# Patient Record
Sex: Male | Born: 1970 | Race: Black or African American | Hispanic: No | Marital: Single | Smoking: Never smoker
Health system: Southern US, Community
[De-identification: ages and names within clinical notes are randomized; demographics above are authoritative.]

## PROBLEM LIST (undated history)

## (undated) DIAGNOSIS — A630 Anogenital (venereal) warts: Secondary | ICD-10-CM

## (undated) DIAGNOSIS — M25511 Pain in right shoulder: Secondary | ICD-10-CM

## (undated) DIAGNOSIS — F32A Depression, unspecified: Secondary | ICD-10-CM

## (undated) DIAGNOSIS — M549 Dorsalgia, unspecified: Secondary | ICD-10-CM

## (undated) DIAGNOSIS — M25512 Pain in left shoulder: Secondary | ICD-10-CM

## (undated) DIAGNOSIS — F329 Major depressive disorder, single episode, unspecified: Secondary | ICD-10-CM

## (undated) DIAGNOSIS — G4730 Sleep apnea, unspecified: Secondary | ICD-10-CM

## (undated) DIAGNOSIS — E7800 Pure hypercholesterolemia, unspecified: Secondary | ICD-10-CM

## (undated) DIAGNOSIS — I10 Essential (primary) hypertension: Secondary | ICD-10-CM

## (undated) DIAGNOSIS — IMO0002 Reserved for concepts with insufficient information to code with codable children: Secondary | ICD-10-CM

## (undated) DIAGNOSIS — M25562 Pain in left knee: Secondary | ICD-10-CM

---

## 2004-07-03 ENCOUNTER — Emergency Department (HOSPITAL_COMMUNITY): Admission: EM | Admit: 2004-07-03 | Discharge: 2004-07-03 | Payer: Self-pay | Admitting: Emergency Medicine

## 2004-07-19 ENCOUNTER — Emergency Department (HOSPITAL_COMMUNITY): Admission: EM | Admit: 2004-07-19 | Discharge: 2004-07-19 | Payer: Self-pay | Admitting: Family Medicine

## 2006-06-10 DIAGNOSIS — M5030 Other cervical disc degeneration, unspecified cervical region: Secondary | ICD-10-CM | POA: Insufficient documentation

## 2007-06-05 ENCOUNTER — Encounter (INDEPENDENT_AMBULATORY_CARE_PROVIDER_SITE_OTHER): Payer: Self-pay | Admitting: Nurse Practitioner

## 2008-07-21 ENCOUNTER — Emergency Department (HOSPITAL_COMMUNITY): Admission: EM | Admit: 2008-07-21 | Discharge: 2008-07-21 | Payer: Self-pay | Admitting: Emergency Medicine

## 2008-08-06 ENCOUNTER — Emergency Department (HOSPITAL_COMMUNITY): Admission: EM | Admit: 2008-08-06 | Discharge: 2008-08-06 | Payer: Self-pay | Admitting: Family Medicine

## 2008-08-15 ENCOUNTER — Emergency Department (HOSPITAL_COMMUNITY): Admission: EM | Admit: 2008-08-15 | Discharge: 2008-08-15 | Payer: Self-pay | Admitting: Family Medicine

## 2008-08-15 DIAGNOSIS — S62233A Other displaced fracture of base of first metacarpal bone, unspecified hand, initial encounter for closed fracture: Secondary | ICD-10-CM | POA: Insufficient documentation

## 2008-10-03 ENCOUNTER — Ambulatory Visit: Payer: Self-pay | Admitting: Nurse Practitioner

## 2008-10-03 DIAGNOSIS — R0989 Other specified symptoms and signs involving the circulatory and respiratory systems: Secondary | ICD-10-CM | POA: Insufficient documentation

## 2008-10-03 DIAGNOSIS — R0609 Other forms of dyspnea: Secondary | ICD-10-CM

## 2008-10-03 DIAGNOSIS — K029 Dental caries, unspecified: Secondary | ICD-10-CM | POA: Insufficient documentation

## 2008-10-03 DIAGNOSIS — I10 Essential (primary) hypertension: Secondary | ICD-10-CM

## 2008-10-19 ENCOUNTER — Ambulatory Visit (HOSPITAL_BASED_OUTPATIENT_CLINIC_OR_DEPARTMENT_OTHER): Admission: RE | Admit: 2008-10-19 | Discharge: 2008-10-19 | Payer: Self-pay | Admitting: Nurse Practitioner

## 2008-10-22 ENCOUNTER — Ambulatory Visit: Payer: Self-pay | Admitting: Internal Medicine

## 2008-10-22 DIAGNOSIS — G4730 Sleep apnea, unspecified: Secondary | ICD-10-CM | POA: Insufficient documentation

## 2008-10-31 ENCOUNTER — Encounter (INDEPENDENT_AMBULATORY_CARE_PROVIDER_SITE_OTHER): Payer: Self-pay | Admitting: Nurse Practitioner

## 2008-11-02 ENCOUNTER — Ambulatory Visit: Payer: Self-pay | Admitting: Nurse Practitioner

## 2008-11-02 DIAGNOSIS — A630 Anogenital (venereal) warts: Secondary | ICD-10-CM

## 2008-11-02 DIAGNOSIS — F17200 Nicotine dependence, unspecified, uncomplicated: Secondary | ICD-10-CM

## 2008-11-04 ENCOUNTER — Encounter (INDEPENDENT_AMBULATORY_CARE_PROVIDER_SITE_OTHER): Payer: Self-pay | Admitting: Nurse Practitioner

## 2008-11-08 ENCOUNTER — Encounter (INDEPENDENT_AMBULATORY_CARE_PROVIDER_SITE_OTHER): Payer: Self-pay | Admitting: Nurse Practitioner

## 2008-11-16 ENCOUNTER — Ambulatory Visit: Payer: Self-pay | Admitting: Nurse Practitioner

## 2008-11-17 ENCOUNTER — Encounter (INDEPENDENT_AMBULATORY_CARE_PROVIDER_SITE_OTHER): Payer: Self-pay | Admitting: Nurse Practitioner

## 2008-11-17 DIAGNOSIS — E7800 Pure hypercholesterolemia, unspecified: Secondary | ICD-10-CM

## 2008-11-17 LAB — CONVERTED CEMR LAB: Triglycerides: 1194 mg/dL — ABNORMAL HIGH (ref <150)

## 2008-12-29 ENCOUNTER — Encounter (INDEPENDENT_AMBULATORY_CARE_PROVIDER_SITE_OTHER): Payer: Self-pay | Admitting: Nurse Practitioner

## 2009-02-10 ENCOUNTER — Ambulatory Visit: Payer: Self-pay | Admitting: Nurse Practitioner

## 2009-02-10 DIAGNOSIS — M545 Low back pain: Secondary | ICD-10-CM | POA: Insufficient documentation

## 2009-02-10 LAB — CONVERTED CEMR LAB: HDL goal, serum: 40 mg/dL

## 2009-02-24 ENCOUNTER — Ambulatory Visit: Payer: Self-pay | Admitting: Nurse Practitioner

## 2009-02-27 ENCOUNTER — Ambulatory Visit: Payer: Self-pay | Admitting: *Deleted

## 2009-02-27 ENCOUNTER — Encounter (INDEPENDENT_AMBULATORY_CARE_PROVIDER_SITE_OTHER): Payer: Self-pay | Admitting: Nurse Practitioner

## 2009-02-27 LAB — CONVERTED CEMR LAB: Total CHOL/HDL Ratio: 6.9

## 2009-03-01 ENCOUNTER — Ambulatory Visit: Payer: Self-pay | Admitting: Nurse Practitioner

## 2009-03-03 ENCOUNTER — Ambulatory Visit (HOSPITAL_COMMUNITY): Admission: RE | Admit: 2009-03-03 | Discharge: 2009-03-03 | Payer: Self-pay | Admitting: Internal Medicine

## 2009-03-21 ENCOUNTER — Emergency Department (HOSPITAL_COMMUNITY): Admission: EM | Admit: 2009-03-21 | Discharge: 2009-03-21 | Payer: Self-pay | Admitting: Family Medicine

## 2009-06-13 ENCOUNTER — Ambulatory Visit: Payer: Self-pay | Admitting: Nurse Practitioner

## 2009-06-13 DIAGNOSIS — M25569 Pain in unspecified knee: Secondary | ICD-10-CM

## 2009-06-13 DIAGNOSIS — M25519 Pain in unspecified shoulder: Secondary | ICD-10-CM

## 2009-06-13 DIAGNOSIS — F341 Dysthymic disorder: Secondary | ICD-10-CM

## 2009-06-13 LAB — CONVERTED CEMR LAB
Bilirubin Urine: NEGATIVE
Glucose, Urine, Semiquant: NEGATIVE
pH: 6

## 2009-06-15 ENCOUNTER — Encounter (INDEPENDENT_AMBULATORY_CARE_PROVIDER_SITE_OTHER): Payer: Self-pay | Admitting: Nurse Practitioner

## 2009-06-15 LAB — CONVERTED CEMR LAB: Cholesterol: 284 mg/dL — ABNORMAL HIGH (ref 0–200)

## 2009-07-03 ENCOUNTER — Encounter (INDEPENDENT_AMBULATORY_CARE_PROVIDER_SITE_OTHER): Payer: Self-pay | Admitting: Nurse Practitioner

## 2009-07-20 ENCOUNTER — Telehealth (INDEPENDENT_AMBULATORY_CARE_PROVIDER_SITE_OTHER): Payer: Self-pay | Admitting: Nurse Practitioner

## 2009-07-25 ENCOUNTER — Ambulatory Visit: Payer: Self-pay | Admitting: Nurse Practitioner

## 2009-07-31 LAB — CONVERTED CEMR LAB
HDL: 50 mg/dL (ref >39)
Triglycerides: 459 mg/dL — ABNORMAL HIGH (ref <150)

## 2009-09-04 ENCOUNTER — Encounter: Admission: RE | Admit: 2009-09-04 | Discharge: 2009-11-29 | Payer: Self-pay | Admitting: Internal Medicine

## 2009-09-07 ENCOUNTER — Encounter (INDEPENDENT_AMBULATORY_CARE_PROVIDER_SITE_OTHER): Payer: Self-pay | Admitting: Nurse Practitioner

## 2009-09-20 ENCOUNTER — Telehealth (INDEPENDENT_AMBULATORY_CARE_PROVIDER_SITE_OTHER): Payer: Self-pay | Admitting: Nurse Practitioner

## 2009-10-04 ENCOUNTER — Encounter (INDEPENDENT_AMBULATORY_CARE_PROVIDER_SITE_OTHER): Payer: Self-pay | Admitting: Nurse Practitioner

## 2009-10-05 ENCOUNTER — Encounter (INDEPENDENT_AMBULATORY_CARE_PROVIDER_SITE_OTHER): Payer: Self-pay | Admitting: *Deleted

## 2009-11-28 ENCOUNTER — Telehealth (INDEPENDENT_AMBULATORY_CARE_PROVIDER_SITE_OTHER): Payer: Self-pay | Admitting: *Deleted

## 2009-12-08 ENCOUNTER — Encounter (INDEPENDENT_AMBULATORY_CARE_PROVIDER_SITE_OTHER): Payer: Self-pay | Admitting: *Deleted

## 2010-01-12 ENCOUNTER — Telehealth (INDEPENDENT_AMBULATORY_CARE_PROVIDER_SITE_OTHER): Payer: Self-pay | Admitting: Nurse Practitioner

## 2010-02-26 ENCOUNTER — Emergency Department (HOSPITAL_COMMUNITY): Admission: EM | Admit: 2010-02-26 | Discharge: 2010-02-26 | Payer: Self-pay | Admitting: Family Medicine

## 2010-06-07 ENCOUNTER — Ambulatory Visit: Payer: Self-pay | Admitting: Nurse Practitioner

## 2010-07-08 LAB — CONVERTED CEMR LAB
Albumin: 4.4 g/dL (ref 3.5–5.2)
Alkaline Phosphatase: 59 units/L (ref 39–117)
Basophils Absolute: 0 10*3/uL (ref 0.0–0.1)
CO2: 23 meq/L (ref 19–32)
Calcium: 9.5 mg/dL (ref 8.4–10.5)
Chlamydia, Swab/Urine, PCR: NEGATIVE
Chloride: 102 meq/L (ref 96–112)
GC Probe Amp, Urine: NEGATIVE
Glucose, Bld: 79 mg/dL (ref 70–99)
Hemoglobin: 16.4 g/dL (ref 13.0–17.0)
Lymphocytes Relative: 23 % (ref 12–46)
Lymphs Abs: 1.8 10*3/uL (ref 0.7–4.0)
Monocytes Absolute: 0.5 10*3/uL (ref 0.1–1.0)
Monocytes Relative: 6 % (ref 3–12)
Neutro Abs: 5.2 10*3/uL (ref 1.7–7.7)
Potassium: 4.1 meq/L (ref 3.5–5.3)
RBC: 5.54 M/uL (ref 4.22–5.81)
Sodium: 141 meq/L (ref 135–145)
Total Protein: 7.7 g/dL (ref 6.0–8.3)
WBC: 7.7 10*3/uL (ref 4.0–10.5)

## 2010-07-12 NOTE — Miscellaneous (Signed)
Summary: Rehab Report//DISCHARGE SUMMARY  Rehab Report//DISCHARGE SUMMARY   Imported By: Arta Bruce 12/06/2009 12:49:37  _____________________________________________________________________  External Attachment:    Type:   Image     Comment:   External Document

## 2010-07-12 NOTE — Assessment & Plan Note (Signed)
Summary: HTN/Hypercholesterolemia   Vital Signs:  Patient profile:   40 year old male Weight:      221.4 pounds BMI:     30.14 BSA:     2.23 Temp:     97.6 degrees F oral Pulse rate:   59 / minute Pulse rhythm:   regular Resp:     16 per minute BP sitting:   150 / 98  (left arm) Cuff size:   regular  Vitals Entered By: Levon Hedger (June 13, 2009 10:24 AM)  Serial Vital Signs/Assessments:  Time      Position  BP       Pulse  Resp  Temp     By 11:51 AM            150/87   63                    Melinda Madtes RN  CC: lower back pain with spasms, shoulders, left knee pain x 1 month....sty on right eye red and swollen, Lipid Management, Depression Is Patient Diabetic? No Pain Assessment Patient in pain? yes     Location: back, shoulders, knee Intensity: 10 Onset of pain  Constant  Does patient need assistance? Functional Status Self care Ambulation Normal   CC:  lower back pain with spasms, shoulders, left knee pain x 1 month....sty on right eye red and swollen, Lipid Management, and Depression.  History of Present Illness:  Pt into the office with continued complaints of back pain and muscle spasms. Spams in right shoulder and lower back.  Left knee pain - feels the knee popping when walking.  Depression History:      The patient presents with symptoms of depression which have been present for greater than two weeks.  The patient is having a depressed mood most of the day and has a diminished interest in his usual daily activities.  Positive alarm features for depression include insomnia and fatigue (loss of energy).  However, he denies recurrent thoughts of death or suicide.        Psychosocial stress factors include major life changes.  The patient denies that he feels like life is not worth living, denies that he wishes that he were dead, and denies that he has thought about ending his life.        Comments:  Pt has noticed a change in himself that is not like  he used to be.  "i'm irritable" No current medications - .  Lipid Management History:      Positive NCEP/ATP III risk factors include hypertension.  Negative NCEP/ATP III risk factors include male age less than 59 years old, non-diabetic, non-tobacco-user status, no ASHD (atherosclerotic heart disease), no prior stroke/TIA, no peripheral vascular disease, and no history of aortic aneurysm.        The patient states that he does not know about the "Therapeutic Lifestyle Change" diet.  The patient does not know about adjunctive measures for cholesterol lowering.  He expresses no side effects from his lipid-lowering medication.  Comments include: Pt has been taking lovaza as ordered as of 3 weeks ago.  The patient denies any symptoms to suggest myopathy or liver disease.      Habits & Providers  Alcohol-Tobacco-Diet     Alcohol drinks/day: <1     Alcohol Counseling: not indicated; use of alcohol is not excessive or problematic     Alcohol type: spirits     Tobacco Status: never  Tobacco Counseling: to quit use of tobacco products     Cigarette Packs/Day: <0.25  Exercise-Depression-Behavior     Does Patient Exercise: no     Exercise Counseling: to improve exercise regimen     Type of exercise: lift weights     Exercise (avg: min/session): <30     Times/week: <3     Have you felt down or hopeless? yes     Have you felt little pleasure in things? yes     Depression Counseling: not indicated; screening negative for depression  Allergies (verified): No Known Drug Allergies  Social History: Does Patient Exercise:  no  Review of Systems General:  Denies fever. Eyes:  Complains of red eye; ? stye in right eye. CV:  Denies chest pain or discomfort. MS:  Complains of joint pain; left knee Low knee and right shoulder - using thera heat pads that he buys over the counter to put on his shoulder and back.  Physical Exam  General:  alert.   Head:  normocephalic.   Eyes:  right upper  eye lid - swollen, no hordeolum present Lungs:  normal breath sounds.   Heart:  normal rate and regular rhythm.     Impression & Recommendations:  Problem # 1:  HYPERTENSION, BENIGN ESSENTIAL (ICD-401.1) BP elevated DASH diet will add lisinopril Orders: UA Dipstick w/o Micro (manual) ([address])  His updated medication list for this problem includes:    Lisinopril 10 Mg Tabs (Lisinopril) ..... One tablet by mouth daily for blood pressure  Problem # 2:  HYPERCHOLESTEROLEMIA (ICD-272.0) will check lipids today His updated medication list for this problem includes:    Lovaza 1 Gm Caps (Omega-3-acid ethyl esters) .Marland Kitchen... 2 capsules by mouth two times a day    Simvastatin 40 Mg Tabs (Simvastatin) ..... One tablet by mouth nightly for cholesterol  Orders: T-Lipid Profile ([id number])  Problem # 3:  KNEE PAIN, LEFT (ICD-719.46)  The following medications were removed from the medication list:    Diclofenac Sodium 75 Mg Tbec (Diclofenac sodium) ..... One tablet by mouth two times a day for pain (take with food)    Tramadol Hcl 50 Mg Tabs (Tramadol hcl) ..... One tablet by mouth two times a day as needed for pain His updated medication list for this problem includes:    Flexeril 10 Mg Tabs (Cyclobenzaprine hcl) ..... One tablet by mouth nightly for as needed for spasms    Naproxen 500 Mg Tabs (Naproxen) ..... One tablet by mouth two times a day as needed for pain  Problem # 4:  TOBACCO ABUSE (ICD-305.1) advised cessation  Problem # 5:  BACK PAIN, LUMBAR (ICD-724.2) chronic. pt has agreed to physical therapy The following medications were removed from the medication list:    Diclofenac Sodium 75 Mg Tbec (Diclofenac sodium) ..... One tablet by mouth two times a day for pain (take with food)    Tramadol Hcl 50 Mg Tabs (Tramadol hcl) ..... One tablet by mouth two times a day as needed for pain His updated medication list for this problem includes:    Flexeril 10 Mg Tabs (Cyclobenzaprine  hcl) ..... One tablet by mouth nightly for as needed for spasms    Naproxen 500 Mg Tabs (Naproxen) ..... One tablet by mouth two times a day as needed for pain  Orders: Physical Therapy Referral (PT)  Complete Medication List: 1)  Lovaza 1 Gm Caps (Omega-3-acid ethyl esters) .... 2 capsules by mouth two times a day 2)  Gabapentin 300 Mg  Caps (Gabapentin) .Marland Kitchen.. 1 capsule by mouth nightly x 1 week then increase to one capsule by mouth two times a day 3)  Flexeril 10 Mg Tabs (Cyclobenzaprine hcl) .... One tablet by mouth nightly for as needed for spasms 4)  Simvastatin 40 Mg Tabs (Simvastatin) .... One tablet by mouth nightly for cholesterol 5)  Lisinopril 10 Mg Tabs (Lisinopril) .... One tablet by mouth daily for blood pressure 6)  Lidoderm 5 % Ptch (Lidocaine) .... Apply to affected area for 12 hours then off 12 hours 7)  Naproxen 500 Mg Tabs (Naproxen) .... One tablet by mouth two times a day as needed for pain 8)  Sertraline Hcl 50 Mg Tabs (Sertraline hcl) .... One tablet by mouth nightly for mood  Other Orders: Misc. Referral (Misc. Ref)  Lipid Assessment/Plan:      Based on NCEP/ATP III, the patient's risk factor category is "2 or more risk factors and a calculated 10 year CAD risk of < 20%".  The patient's lipid goals are as follows: Total cholesterol goal is 200; LDL cholesterol goal is 160; HDL cholesterol goal is 40; Triglyceride goal is 150.    Patient Instructions: 1)  Cholesterol - Your cholesterol will be rechecked today.  Hopefully, it has improved as it was VERY high before. 2)  High Blood pressure - You will need to start lisinopril 10mg  by mouth daily for blood pressure 3)  Apply warm compresses to right eye at least every 2 hours until the swelling goes down. 4)  Back pain - Apply lidoderm patch to affected area - back and shoulder for 12 hours and off for 12 hours 5)  Depression - schedule an appointment with Aquilla Solian to discuss some of the sources of depression 6)   Anxiety/Depression - tingling is due to mood. 7)  You should start zoloft 50mg  by mouth nightly for mood 8)  Follow up in 1 month for medication review - zolft. Prescriptions: SERTRALINE HCL 50 MG TABS (SERTRALINE HCL) One tablet by mouth nightly for mood  #30 x 3   Entered and Authorized by:   Lehman Prom FNP   Signed by:   Lehman Prom FNP on 06/13/2009   Method used:   Faxed to ...       [facility] (retail)       [address] [place], [place]  [address]       Ph: [phone]       Fax: [phone]   RxID:   [id number] NAPROXEN 500 MG TABS (NAPROXEN) One tablet by mouth two times a day as needed for pain  #50 x 0   Entered and Authorized by:   Lehman Prom FNP   Signed by:   Lehman Prom FNP on 06/13/2009   Method used:   Faxed to ...       [facility] - [facility] (retail)       [address] [place], [place]  [address]       Ph: [phone]       Fax: [phone]   RxID:   [id number] LIDODERM 5 % PTCH (LIDOCAINE) Apply to affected area for 12 hours then off 12 hours  #30 x 0   Entered and Authorized by:   Lehman Prom FNP   Signed by:   Lehman Prom FNP on 06/13/2009   Method used:   Faxed to .Marland KitchenMarland Kitchen  [facility] - [facility] (retail)       [address] [place], [place]  [address]       Ph: [phone]       Fax: [phone]   RxID:   [id number] FLEXERIL 10 MG TABS (CYCLOBENZAPRINE HCL) One tablet by mouth nightly for as needed for spasms  #30 x 0   Entered and Authorized by:   Lehman Prom FNP   Signed by:   Lehman Prom FNP on 06/13/2009   Method used:   Faxed to ...       [facility] - [facility] (retail)       [address] [place], [place]  [address]       Ph: [phone]       Fax: [phone]   RxID:   [id number] LISINOPRIL 10 MG TABS (LISINOPRIL) One tablet by  mouth daily for blood pressure  #30 x 3   Entered and Authorized by:   Lehman Prom FNP   Signed by:   Lehman Prom FNP on 06/13/2009   Method used:   Faxed to ...       [facility] - [facility] (retail)       [address] [place], [place]  [address]       Ph: [phone]       Fax: [phone]   RxID:   [id number]   Laboratory Results   Urine Tests  Date/Time Received: June 13, 2009 10:37 AM  Date/Time Reported: June 13, 2009 10:37 AM   Routine Urinalysis   Color: lt. yellow Appearance: Clear Glucose: negative   (Normal Range: Negative) Bilirubin: negative   (Normal Range: Negative) Ketone: negative   (Normal Range: Negative) Spec. Gravity: 1.020   (Normal Range: 1.003-1.035) Blood: negative   (Normal Range: Negative) pH: 6.0   (Normal Range: 5.0-8.0) Protein: 30   (Normal Range: Negative) Urobilinogen: 0.2   (Normal Range: 0-1) Nitrite: negative   (Normal Range: Negative) Leukocyte Esterace: negative   (Normal Range: Negative)        Prevention & Chronic Care Immunizations   Influenza vaccine: Not documented   Influenza vaccine deferral: Refused  (06/13/2009)    Tetanus booster: 11/02/2008: Tdap    Pneumococcal vaccine: Not documented  Other Screening   Smoking status: never  (06/13/2009)  Lipids   Total Cholesterol: 329  (02/24/2009)   LDL: See Comment mg/dL  (71/11/2692)   LDL Direct: Not documented   HDL: 48  (02/24/2009)   Triglycerides: 734  (02/24/2009)    SGOT (AST): 20  (11/02/2008)   SGPT (ALT): 23  (11/02/2008)   Alkaline phosphatase: 59  (11/02/2008)   Total bilirubin: 0.3  (11/02/2008)  Hypertension   Last Blood Pressure: 150 / 98  (06/13/2009)   Serum creatinine: 1.41  (11/02/2008)   Serum potassium 4.1  (11/02/2008)  Self-Management Support :    Hypertension self-management support: Not documented    Lipid self-management support: Not documented

## 2010-07-12 NOTE — Letter (Signed)
Summary: MAILED REQUESTED RECORDS TO [facility]  MAILED REQUESTED RECORDS TO [facility]   Imported By: Arta Bruce 07/03/2009 09:14:29  _____________________________________________________________________  External Attachment:    Type:   Image     Comment:   External Document

## 2010-07-12 NOTE — Miscellaneous (Signed)
Summary: Rehab Report//INITIAL SUMMARY  Rehab Report//INITIAL SUMMARY   Imported By: Arta Bruce 09/19/2009 09:58:09  _____________________________________________________________________  External Attachment:    Type:   Image     Comment:   External Document

## 2010-07-12 NOTE — Progress Notes (Signed)
Summary: STILL WAITING HIS PT REFERRAL  Phone Note Call from Patient Call back at Home Phone [phone]   Reason for Call: Referral Summary of Call: [facility]. MR Kneip CALLED AND SAYS THAT HE HASN'T STARTED HIS PHYSICAL THERAPY, BECAUSE HE WAS TOLD BY THEM THAT THEY ARE WAITING ON A REFERRAL PAPER FROM [place]. HE IS SUPOSE TO BE GOING TO [facility] ON [address]  MR Schepers WANTED TO LET YOU KNOW THAT HE HAS  NO REFILLS ON ANY OF HIS MEDS, HE IS HAVING TO GO BACK THRU [facility], HE LOST HIS ID AND JUST HAD THAT REPLACED, AND HE IS GOING TO DO A WALK-IN [facility] NEXT WEEK. Initial call taken by: Leodis Rains,  September 20, 2009 12:35 PM  Follow-up for Phone Call        forward to N. Daphine Deutscher, FNP Follow-up by: Levon Hedger,  September 26, 2009 11:04 AM  Additional Follow-up for Phone Call Additional follow up Details #1::        unsure why pt was not contacted regarding referral - done back in January  may need to speak with them to verify story New referral ordered with today's date ok to fax so they can scheduled pt Additional Follow-up by: Lehman Prom FNP,  September 26, 2009 12:04 PM    Additional Follow-up for Phone Call Additional follow up Details #2::    Pt noshowed on 08-08-09, then cancelled on 08-23-09 and had his 1st appt on 09-04-09.So I'm not sure what he's talking about. Please help me if you can Follow-up by: Candi Leash,  September 26, 2009 3:07 PM  Additional Follow-up for Phone Call Additional follow up Details #3:: Details for Additional Follow-up Action Taken: Thanks Delma Post - notify pt of the above information as reseached by debra. it is now his responsibilty to keep the appt.  he can't be helped if he will not go to physical therapy and do treatments as ordered n.martin,fnp September 26, 2009  3:13 PM  left msg with lady to have pt call us back.......... Vesta Mixer CMA  September 28, 2009 9:49 AM  Left message with lady for pt to call back.Marland KitchenMarland KitchenMarland KitchenArmenia Shannon  October 03, 2009 2:44 PM called [phone] left message with person that answered to have pt return call to the office.  Will mail letter. Levon Hedger  October 05, 2009 12:50 PM    Additional Follow-up by: Levon Hedger,  October 05, 2009 12:50 PM

## 2010-07-12 NOTE — Letter (Signed)
Summary: *HSN Results Follow up  [facility]  [address] [place], [place] [address]   Phone: [phone]  Fax: [phone]      10/05/2009   [facility] [address] [address] [place], [place]  [address]   Dear  Mr. Majel Homer,                            ____S.Drinkard,FNP   ____D. Gore,FNP       ____B. McPherson,MD   ____V. Rankins,MD    ____E. Mulberry,MD    ____N. Daphine Deutscher, FNP  ____D. Reche Dixon, MD    ____K. Philipp Deputy, MD    ____Other     This letter is to inform you that your recent test(s):  _______Pap Smear    _______Lab Test     _______X-ray    _______ is within acceptable limits  _______ requires a medication change  _______ requires a follow-up lab visit  _______ requires a follow-up visit with your provider   Comments:  We have tried contacting you at [phone].  Please contact the office at your earliest convenience.  This is in reference to physical therapy.       _________________________________________________________ If you have any questions, please contact our office                     Sincerely,  Levon Hedger [facility]

## 2010-07-12 NOTE — Progress Notes (Signed)
Summary: Requesting the Provider call him back  Phone Note Call from Patient Call back at Home Phone [phone]   Summary of Call: Mr. Oguinn wants the provider call him back. [facility] Initial call taken by: Manon Hilding,  July 20, 2009 10:15 AM  Follow-up for Phone Call        pt has appt to come on 2/15 @10 :15 for this issue. Follow-up by: Levon Hedger,  July 24, 2009 2:27 PM

## 2010-07-12 NOTE — Miscellaneous (Signed)
Summary: Rehab Report/INITIAL SUMMARY  Rehab Report/INITIAL SUMMARY   Imported By: Arta Bruce 09/08/2009 10:14:01  _____________________________________________________________________  External Attachment:    Type:   Image     Comment:   External Document

## 2010-07-12 NOTE — Letter (Signed)
Summary: *Referral Letter  [facility]  [address] [place], [place] [address]   Phone: [phone]  Fax: [phone]    07/25/2009  Thomas Lambert [address] [place], [place]  [address]  Phone: [phone]  To whom it may concern: Mr. Samek has been seen in this office for a variety of problems.  See below for details.  He does have problems with his neck and back that limit his ability perform his normal work duties.  He is not able to walk for extended periods of time, nor is he able to lift or do any straining activities.   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) 8)  TOBACCO ABUSE (ICD-305.1) 9)  PHYSICAL EXAMINATION (ICD-V70.0) 10)  SLEEP APNEA (ICD-780.57) 11)  DENTAL CARIES (ICD-521.00) 12)  FRACTURE, THUMB BASE (ICD-815.01) 13)  HYPERTENSION, BENIGN ESSENTIAL (ICD-401.1) 14)  SNORING (ICD-786.09)   Current Medications: 1)  LOVAZA 1 GM CAPS (OMEGA-3-ACID ETHYL ESTERS) 2 capsules by mouth two times a day 2)  GABAPENTIN 300 MG CAPS (GABAPENTIN) 1 capsule by mouth nightly x 1 week then increase to one capsule by mouth two times a day 3)  FLEXERIL 10 MG TABS (CYCLOBENZAPRINE HCL) One tablet by mouth nightly for as needed for spasms 4)  SIMVASTATIN 40 MG TABS (SIMVASTATIN) One tablet by mouth nightly for cholesterol 5)  LISINOPRIL 10 MG TABS (LISINOPRIL) One tablet by mouth daily for blood pressure 6)  LIDODERM 5 % PTCH (LIDOCAINE) Apply to affected area for 12 hours then off 12 hours 7)  NAPROXEN 500 MG TABS (NAPROXEN) One tablet by mouth two times a day as needed for pain 8)  SERTRALINE HCL 50 MG TABS (SERTRALINE HCL) One tablet by mouth nightly for mood   Please contact us if  you have any further questions or need additional information.  Sincerely,   Lehman Prom FNP [facility]

## 2010-07-12 NOTE — Progress Notes (Signed)
Summary: NEEDS REFILLS  Phone Note Refill Request   Refills Requested: Medication #1:  LOVAZA 1 GM CAPS 2 capsules by mouth two times a day  Medication #2:  FLEXERIL 10 MG TABS One tablet by mouth nightly for as needed for spasms  Medication #3:  SIMVASTATIN 40 MG TABS One tablet by mouth nightly for cholesterol  Medication #4:  SERTRALINE HCL 50 MG TABS One tablet by mouth nightly for mood. LIDODERM, LISINOPRIL, NAPROXEN..... [facility] PATIENT WANTED TO LET YOU KNOW HE HAS MEDICAID NOW  Initial call taken by: Leodis Rains,  January 12, 2010 9:16 AM  Follow-up for Phone Call        forward to N. Daphine Deutscher, fnp Follow-up by: Levon Hedger,  January 12, 2010 10:25 AM  Additional Follow-up for Phone Call Additional follow up Details #1::        meds sent to [facility] except lidoderm patch medicaid will not cover these he needs to bring a copy of his medicaid card into this office so we can have on file Additional Follow-up by: Lehman Prom FNP,  January 12, 2010 10:28 AM    Additional Follow-up for Phone Call Additional follow up Details #2::    Levon Hedger  January 12, 2010 2:49 PM Left message on machine for pt to return call to the office.  Levon Hedger  January 15, 2010 9:45 AM pt informed of above information. Follow-up by: Levon Hedger,  January 15, 2010 9:45 AM  Prescriptions: NAPROXEN 500 MG TABS (NAPROXEN) One tablet by mouth two times a day as needed for pain  #50 x 0   Entered and Authorized by:   Lehman Prom FNP   Signed by:   Lehman Prom FNP on 01/12/2010   Method used:   Electronically to        [facility] [address] (retail)       [address]       [place], [place]  [address]       Ph: [phone]       Fax: [phone]   RxID:   [id number] SERTRALINE HCL 50 MG TABS (SERTRALINE HCL) One tablet by mouth nightly for mood  #30 x 3   Entered and Authorized by:   Lehman Prom FNP   Signed by:   Lehman Prom FNP on 01/12/2010   Method used:   Electronically to        [facility] [address] (retail)       [address]       [place], [place]  [address]       Ph: [phone]       Fax: [phone]   RxID:   [id number] LISINOPRIL 10 MG TABS (LISINOPRIL) One tablet by mouth daily for blood pressure  #30 x 3   Entered and Authorized by:   Lehman Prom FNP   Signed by:   Lehman Prom FNP on 01/12/2010   Method used:   Electronically to        [facility] [address] (retail)       [address]       [place], [place]  [address]       Ph: [phone]       Fax: [phone]   RxID:   [id number]  SIMVASTATIN 40 MG TABS (SIMVASTATIN) One tablet by mouth nightly for cholesterol  #30 x 3   Entered and Authorized by:   Lehman Prom FNP   Signed by:   Lehman Prom FNP on 01/12/2010   Method used:   Electronically to        [facility] [address] (retail)       [address]       [place], [place]  [address]       Ph: [phone]       Fax: [phone]   RxID:   [id number] FLEXERIL 10 MG TABS (CYCLOBENZAPRINE HCL) One tablet by mouth nightly for as needed for spasms  #30 x 0   Entered and Authorized by:   Lehman Prom FNP   Signed by:   Lehman Prom FNP on 01/12/2010   Method used:   Electronically to        [facility] [address] (retail)       [address]       [place], [place]  [address]       Ph: [phone]       Fax: [phone]   RxID:   [id number] GABAPENTIN 300 MG CAPS (GABAPENTIN) 1 capsule by mouth nightly x 1 week then increase to one capsule by mouth two times a day  #60 x 3   Entered and Authorized by:   Lehman Prom FNP   Signed by:   Lehman Prom FNP on 01/12/2010   Method used:   Electronically to        [facility] [address] (retail)       [address] [place], [place]  [address]        Ph: [phone]       Fax: [phone]   RxID:   [id number] LOVAZA 1 GM CAPS (OMEGA-3-ACID ETHYL ESTERS) 2 capsules by mouth two times a day  #120 x 3   Entered and Authorized by:   Lehman Prom FNP   Signed by:   Lehman Prom FNP on 01/12/2010   Method used:   Electronically to        [facility] [address] (retail)       [address] [place], [place]  [address]       Ph: [phone]       Fax: [phone]   RxID:   [id number]

## 2010-07-12 NOTE — Progress Notes (Signed)
Summary: Refills request  Phone Note Call from Patient Call back at [phone]   Summary of Call: The pt needs to leave a very important message for his proivider.  The pt lost his wallet and he is looking forward to update his card but in the meantime; he needs refills from all his medications.  [facility] or [facility] [address] [phone]) Daphine Deutscher FnP Initial call taken by: Manon Hilding,  November 28, 2009 4:25 PM  Follow-up for Phone Call        forward to N. Daphine Deutscher, FNP Follow-up by: Levon Hedger,  November 28, 2009 4:28 PM  Additional Follow-up for Phone Call Additional follow up Details #1::        OR - find out exactly [facility] pharmacy pt wants his medications sent to.  I can't sent do Either/OR. If he gets some from one pharmacy and others from another I need to know that specifically - I can't guess that information  Left a message to the pt with a family member to return my phone call.Manon Hilding  December 01, 2009 9:20 AM    Additional Follow-up for Phone Call Additional follow up Details #2::    Levon Hedger  December 02, 2009 12:27 PM Left message for pt to return call to the office.  Levon Hedger  December 05, 2009 2:27 PM Left message for pt to return call to the office.  Levon Hedger  December 08, 2009 9:02 AM Left message for pt to eturn call to the office.  Will mail letter.

## 2010-07-12 NOTE — Assessment & Plan Note (Signed)
Summary: Back pain   Vital Signs:  Patient profile:   40 year old male Weight:      213.4 pounds BMI:     29.05 BSA:     2.19 Temp:     97.3 degrees F oral Pulse rate:   67 / minute Pulse rhythm:   regular Resp:     16 per minute BP sitting:   125 / 84  (left arm) Cuff size:   regular  Vitals Entered By: Levon Hedger (July 25, 2009 10:53 AM) CC: Pain radiating from left side shoulder all the way down to his leg, Back Pain, Hypertension Management, Lipid Management, Depression Is Patient Diabetic? No Pain Assessment Patient in pain? yes     Location: LEFT SIDE Intensity: 10 Onset of pain  Constant  Does patient need assistance? Functional Status Self care Ambulation Normal   CC:  Pain radiating from left side shoulder all the way down to his leg, Back Pain, Hypertension Management, Lipid Management, and Depression.  History of Present Illness:  Pt is into the office for follow up on back. Physical therapy - pt was ordered back in January however pt was not notified of the time date of the appointment. Back pain - continues with radiation down into the left leg.  Naprosyn was inadvertently discarded by a family member  Sleep Apnea - CPAP in place. [facility].    Back Pain History:      The patient's back pain has been present for > 6 weeks.  The pain is located in the lower back region and does radiate below the knees.  He states that he has had a prior history of back pain.  The patient has not had any recent physical therapy for his back pain.        Other comments:  Pt is also started the lidoderm patch as ordered following his last visit. He does see some relief.  Muscle spasm medication (cyclobenaoprine) is also helping.    Critical Exclusionary Diagnosis Criteria (CEDC) for Back Pain:      There are no symptoms to suggest infection, cancer, cauda equina, or psychosocial factors for back pain.  Other positive CEDC factors  include low back pain worse with lumbar extension (downhill walking-standing-reaching overhead).    Depression History:      Positive alarm features for depression include fatigue (loss of energy).        The patient denies that he feels like life is not worth living, denies that he wishes that he were dead, and denies that he has thought about ending his life.        Comments:  Pt has started the zoloft as ordered He did not make an appointment with Aquilla Solian.  Hypertension History:      He denies headache, chest pain, and palpitations.  He notes no problems with any antihypertensive medication side effects.        Positive major cardiovascular risk factors include hyperlipidemia and hypertension.  Negative major cardiovascular risk factors include male age less than 75 years old, no history of diabetes, and non-tobacco-user status.        Further assessment for target organ damage reveals no history of ASHD, cardiac end-organ damage (CHF/LVH), stroke/TIA, peripheral vascular disease, renal insufficiency, or hypertensive retinopathy.    Lipid Management History:      Positive NCEP/ATP III risk factors include hypertension.  Negative NCEP/ATP III risk factors include male age less than 45 years  old, non-diabetic, non-tobacco-user status, no ASHD (atherosclerotic heart disease), no prior stroke/TIA, no peripheral vascular disease, and no history of aortic aneurysm.        The patient states that he does not know about the "Therapeutic Lifestyle Change" diet.  Comments include: Pt is taking lovaza as ordered but reports that he is not taking simvastatin.        Habits & Providers  Alcohol-Tobacco-Diet     Alcohol drinks/day: <1     Alcohol Counseling: not indicated; use of alcohol is not excessive or problematic     Alcohol type: spirits     Tobacco Status: never     Tobacco Counseling: to quit use of tobacco products     Cigarette Packs/Day: <0.25  Exercise-Depression-Behavior      Does Patient Exercise: no     Exercise Counseling: to improve exercise regimen     Type of exercise: lift weights     Exercise (avg: min/session): <30     Times/week: <3     Depression Counseling: not indicated; screening negative for depression  Allergies (verified): No Known Drug Allergies  Review of Systems General:  Denies fever. CV:  Denies chest pain or discomfort. Resp:  Denies cough. GI:  Denies abdominal pain, nausea, and vomiting. MS:  Complains of joint redness and low back pain. Psych:  Complains of depression.  Physical Exam  General:  alert.   Head:  normocephalic.   Neck:  decreased ROM Lungs:  normal breath sounds.   Heart:  normal rate and regular rhythm.   Abdomen:  soft, non-tender, and normal bowel sounds.   Neurologic:  alert & oriented X3.   Skin:  color normal.   Psych:  Oriented X3.     Impression & Recommendations:  Problem # 1:  BACK PAIN, LUMBAR (ICD-724.2) Pt will start physical therapy - ordered on march 1st His updated medication list for this problem includes:    Flexeril 10 Mg Tabs (Cyclobenzaprine hcl) ..... One tablet by mouth nightly for as needed for spasms    Naproxen 500 Mg Tabs (Naproxen) ..... One tablet by mouth two times a day as needed for pain  Problem # 2:  HYPERTENSION, BENIGN ESSENTIAL (ICD-401.1) BP is stable DASH diet His updated medication list for this problem includes:    Lisinopril 10 Mg Tabs (Lisinopril) ..... One tablet by mouth daily for blood pressure  Problem # 3:  DEPRESSION/ANXIETY (ICD-300.4) pt has started zoloft as ordered encouraged him to see Aquilla Solian  Problem # 4:  DEGENERATIVE DISC DISEASE, CERVICAL SPINE (ICD-722.4) physical therapy ordered still ongoing problems - advised pt that he will need referral to ortho for recurrent problems  Problem # 5:  HYPERCHOLESTEROLEMIA (ICD-272.0)  Pt has not been taking simvastatin  advised him to take this in addtion to Lovaza His updated medication  list for this problem includes:    Lovaza 1 Gm Caps (Omega-3-acid ethyl esters) .Marland Kitchen... 2 capsules by mouth two times a day    Simvastatin 40 Mg Tabs (Simvastatin) ..... One tablet by mouth nightly for cholesterol  Orders: T-Lipid Profile ([id number])  Complete Medication List: 1)  Lovaza 1 Gm Caps (Omega-3-acid ethyl esters) .... 2 capsules by mouth two times a day 2)  Gabapentin 300 Mg Caps (Gabapentin) .Marland Kitchen.. 1 capsule by mouth nightly x 1 week then increase to one capsule by mouth two times a day 3)  Flexeril 10 Mg Tabs (Cyclobenzaprine hcl) .... One tablet by mouth nightly for as needed for spasms 4)  Simvastatin 40 Mg Tabs (Simvastatin) .... One tablet by mouth nightly for cholesterol 5)  Lisinopril 10 Mg Tabs (Lisinopril) .... One tablet by mouth daily for blood pressure 6)  Lidoderm 5 % Ptch (Lidocaine) .... Apply to affected area for 12 hours then off 12 hours 7)  Naproxen 500 Mg Tabs (Naproxen) .... One tablet by mouth two times a day as needed for pain 8)  Sertraline Hcl 50 Mg Tabs (Sertraline hcl) .... One tablet by mouth nightly for mood  Hypertension Assessment/Plan:      The patient's hypertensive risk group is category B: At least one risk factor (excluding diabetes) with no target organ damage.  Today's blood pressure is 125/84.  His blood pressure goal is < 140/90.  Lipid Assessment/Plan:      Based on NCEP/ATP III, the patient's risk factor category is "0-1 risk factors".  The patient's lipid goals are as follows: Total cholesterol goal is 200; LDL cholesterol goal is 160; HDL cholesterol goal is 40; Triglyceride goal is 150.    Patient Instructions: 1)  Physical therapy - March 1st at 9:00AM 2)  [address] [phone] 3)  You will learn how to do some exercises at home. 4)  Be sure to keep this appointment as ordered. 5)  Cholesterol - Will recheck today.  May be improved some but likely still will need to take lovaza and simvastatin. 6)  Follow up as  needed Prescriptions: NAPROXEN 500 MG TABS (NAPROXEN) One tablet by mouth two times a day as needed for pain  #50 x 0   Entered and Authorized by:   Lehman Prom FNP   Signed by:   Lehman Prom FNP on 07/25/2009   Method used:   Faxed to ...       [facility] (retail)       [address] [place], [place]  [address]       Ph: [phone]       Fax: [phone]   RxID:   [id number] SIMVASTATIN 40 MG TABS (SIMVASTATIN) One tablet by mouth nightly for cholesterol  #30 x 3   Entered and Authorized by:   Lehman Prom FNP   Signed by:   Lehman Prom FNP on 07/25/2009   Method used:   Faxed to ...       [facility] - [facility] (retail)       [address] [place], [place]  [address]       Ph: [phone]       Fax: [phone]   RxID:   [id number] FLEXERIL 10 MG TABS (CYCLOBENZAPRINE HCL) One tablet by mouth nightly for as needed for spasms  #30 x 0   Entered and Authorized by:   Lehman Prom FNP   Signed by:   Lehman Prom FNP on 07/25/2009   Method used:   Faxed to ...       [facility] - [facility] (retail)       [address] [place], [place]  [address]       Ph: [phone]       Fax: [phone]   RxID:   [id number]

## 2010-07-12 NOTE — Letter (Signed)
Summary: Lipid Letter  [facility]  [address] [place], [place] [address]   Phone: [phone]  Fax: [phone]    06/15/2009  Thomas Lambert [address] [place], [place]  [address]  Dear Derek Mound:  We have carefully reviewed your last lipid profile from 06/13/2009 and the results are noted below with a summary of recommendations for lipid management.    Cholesterol:       284     Goal: less than 200   HDL "good" Cholesterol:   48     Goal: greater than 40   LDL "bad" Cholesterol:  Too high to calculate   Triglycerides:       653     Goal: less than 150    Your cholesterol is DANGEROUSLY high. This is what leads to heart attack and stroke. You have 2 different cholesterol medications that you should be taking. Be sure you request the refills from the pharmacy and take as ordered.     Current Medications: 1)    Lovaza 1 Gm Caps (Omega-3-acid ethyl esters) .... 2 capsules by mouth two times a day 2)    Gabapentin 300 Mg Caps (Gabapentin) .Marland Kitchen.. 1 capsule by mouth nightly x 1 week then increase to one capsule by mouth two times a day 3)    Flexeril 10 Mg Tabs (Cyclobenzaprine hcl) .... One tablet by mouth nightly for as needed for spasms 4)    Diclofenac Sodium 75 Mg Tbec (Diclofenac sodium) .... One tablet by mouth two times a day for pain (take with food) 5)    Simvastatin 40 Mg Tabs (Simvastatin) .... One tablet by mouth nightly for cholesterol 6)    Tramadol Hcl 50 Mg Tabs (Tramadol hcl) .... One tablet by mouth two times a day as needed for pain 7)    Lisinopril 10 Mg Tabs (Lisinopril) .... One tablet by mouth daily for blood pressure 8)    Lidoderm 5 % Ptch (Lidocaine) .... Apply to affected area for 12 hours then off 12 hours 9)    Naproxen 500 Mg Tabs (Naproxen) .... One tablet by mouth two times a day as needed for pain 10)    Sertraline Hcl 50 Mg Tabs (Sertraline hcl) .... One tablet by mouth nightly for mood  If you have any questions, please call.  We appreciate being able to work with you.   Sincerely,    [facility] Lehman Prom FNP

## 2010-07-12 NOTE — Letter (Signed)
Summary: *HSN Results Follow up  [facility]  [address] [place], [place] [address]   Phone: [phone]  Fax: [phone]      12/08/2009   BENJAMIN CASANAS [address] [place], [place]  [address]   Dear  Mr. Majel Homer,                            ____S.Drinkard,FNP   ____D. Gore,FNP       ____B. McPherson,MD   ____V. Rankins,MD    ____E. Mulberry,MD    ____N. Daphine Deutscher, FNP  ____D. Reche Dixon, MD    ____K. Philipp Deputy, MD    ____Other     This letter is to inform you that your recent test(s):  _______Pap Smear    _______Lab Test     _______X-ray    _______ is within acceptable limits  _______ requires a medication change  _______ requires a follow-up lab visit  _______ requires a follow-up visit with your provider   Comments: We have been trying to reach you at [phone].  Please contact the office at your earliest convenience.  This is in reference to your medication.      _________________________________________________________ If you have any questions, please contact our office                     Sincerely,  Levon Hedger [facility]

## 2010-07-27 ENCOUNTER — Telehealth (INDEPENDENT_AMBULATORY_CARE_PROVIDER_SITE_OTHER): Payer: Self-pay | Admitting: Nurse Practitioner

## 2010-08-07 NOTE — Progress Notes (Signed)
Summary: Needs chiropractic referral  Phone Note Call from Patient Call back at Home Phone [phone] Call back at [phone]   Summary of Call: pt saw a  chirapatric for the problems he is having for his neck and lower back.... since he has medicad they said that he would need a referral to see them.... pt has appt on Monday...Thomas KitchenMarland Lambert pt would like a call back to make sure he can go to appt at [facility] on Monday or if he should rescheudle Initial call taken by: [place] Shannon,  July 27, 2010 3:10 PM  Follow-up for Phone Call        Left message on answering machine for pt. to return call.  Dutch Quint RN  July 27, 2010 3:21 PM  Left message on answering machine for pt. to return call.  Last seen 07/25/09 for back pain.  Would he need another appointment for referral?  Dutch Quint RN  July 30, 2010 4:35 PM    Additional Follow-up for Phone Call Additional follow up Details #1::        Referral done - fax to that office find out from pt where he wants it sent  he does need a f/u appt with provider as it has been 1 year since his last visit Additional Follow-up by: Lehman Prom FNP,  July 31, 2010 1:40 PM    Additional Follow-up for Phone Call Additional follow up Details #2::    Pt. requests that referral sent to [facility] - Phone [phone] // Fax [phone].  Appt. scheduled with provider 08/17/10.  Dutch Quint RN  August 02, 2010 10:32 AM  Noted I faxe the referral to [facility] and pt has an appt 08-17-10  Follow-up by: Cheryll Dessert,  August 02, 2010 3:37 PM

## 2010-10-23 NOTE — Procedures (Signed)
NAME:  Thomas Lambert, Thomas Lambert NO.:  [id number]   MEDICAL RECORD NO.:  [id number]          PATIENT TYPE:  OUT   LOCATION:  SLEEP CENTER                 FACILITY[facility]  [facility]   PHYSICIAN:  Clinton D. Maple Hudson, MD, FCCP, FACPDATE OF BIRTH:  1969-03-08   DATE OF STUDY:                            NOCTURNAL POLYSOMNOGRAM   REFERRING PHYSICIAN:  Lehman Prom   INDICATION FOR STUDY:  Hypersomnia with sleep apnea.   EPWORTH SLEEPINESS SCORE:  4/24.  BMI 29.8.  Weight 220 pounds.  Height  72 inches.  Neck 16 inches.   MEDICATIONS:  None listed.   SLEEP ARCHITECTURE:  Split study protocol.  During the diagnostic phase,  total sleep time was 126 minutes with sleep efficiency 68.2%.  Stage I  was 10.3%.  Stage II was 89.7%.  Stages III and REM were absent.  Sleep  latency 29 minutes.  REM latency NA.  Awake after sleep onset 22  minutes.  Arousal index 101 indicating increased EEG arousal.  No  bedtime medication was taken.   RESPIRATORY DATA:  Split study protocol.  During the diagnostic phase,  apnea/hypopnea index (AHI) 56.9 per hour.  A total of 120 events were  scored including 9 obstructive apneas and 111 hypopneas.  Events were  positional, much more common while supine.  CPAP was then titrated to 12  CWP, AHI 0 per hour.  He wore a medium ResMed Mirage Quattro full face  mask with heated humidifier.   OXYGEN DATA:  Very loud snoring before CPAP with oxygen desaturation to  a nadir of 87%.  After CPAP control, mean oxygen saturation held 97.9%  on room air.   CARDIAC DATA:  Normal sinus rhythm.   MOVEMENT-PARASOMNIA:  A few limb jerks were noted during CPAP titration,  insignificant.  Bathroom x1.   IMPRESSIONS-RECOMMENDATIONS:  1. Severe obstructive sleep apnea/hypopnea syndrome, AHI 56.9 per hour      with events more common while supine, but seen in all sleep      positions.  Loud snoring with oxygen desaturation to a nadir of      87%.  2. Successful CPAP titration  to 12 CWP, AHI 0 per hour.  He wore a      medium ResMed Mirage Quattro full face mask with heated humidifier.      Clinton D. Maple Hudson, MD, [facility], FACP  Diplomate, Biomedical engineer of Sleep Medicine  Electronically Signed     CDY/MEDQ  D:  10/22/2008 14:57:36  T:  10/23/2008 07:14:48  Job:  [id number]

## 2011-05-20 ENCOUNTER — Emergency Department (HOSPITAL_COMMUNITY)
Admission: EM | Admit: 2011-05-20 | Discharge: 2011-05-20 | Discharge status: Against Medical Advice | Payer: Medicaid Other | Source: Home / Self Care

## 2011-05-20 NOTE — ED Notes (Signed)
Pt called in all waiting areas, unable to locate pt

## 2011-05-21 ENCOUNTER — Encounter: Payer: Self-pay | Admitting: *Deleted

## 2011-05-21 ENCOUNTER — Emergency Department (HOSPITAL_COMMUNITY)
Admission: EM | Admit: 2011-05-21 | Discharge: 2011-05-22 | Disposition: A | Discharge status: Other Acute Inpatient Hospital | Payer: Medicaid Other | Source: Home / Self Care | Attending: Emergency Medicine | Admitting: Emergency Medicine

## 2011-05-21 DIAGNOSIS — R443 Hallucinations, unspecified: Secondary | ICD-10-CM | POA: Insufficient documentation

## 2011-05-21 HISTORY — DX: Dorsalgia, unspecified: M54.9

## 2011-05-21 HISTORY — DX: Pure hypercholesterolemia, unspecified: E78.00

## 2011-05-21 HISTORY — DX: Anogenital (venereal) warts: A63.0

## 2011-05-21 HISTORY — DX: Essential (primary) hypertension: I10

## 2011-05-21 HISTORY — DX: Depression, unspecified: F32.A

## 2011-05-21 HISTORY — DX: Sleep apnea, unspecified: G47.30

## 2011-05-21 HISTORY — DX: Pain in left knee: M25.562

## 2011-05-21 HISTORY — DX: Reserved for concepts with insufficient information to code with codable children: IMO0002

## 2011-05-21 HISTORY — DX: Major depressive disorder, single episode, unspecified: F32.9

## 2011-05-21 HISTORY — DX: Pain in left shoulder: M25.512

## 2011-05-21 HISTORY — DX: Pain in right shoulder: M25.511

## 2011-05-21 LAB — RAPID URINE DRUG SCREEN, HOSP PERFORMED
Amphetamines: NOT DETECTED
Benzodiazepines: NOT DETECTED
Cocaine: NOT DETECTED
Opiates: NOT DETECTED

## 2011-05-21 LAB — URINALYSIS, ROUTINE W REFLEX MICROSCOPIC
Glucose, UA: NEGATIVE mg/dL
Hgb urine dipstick: NEGATIVE
Protein, ur: NEGATIVE mg/dL
pH: 7 (ref 5.0–8.0)

## 2011-05-21 LAB — DIFFERENTIAL
Basophils Absolute: 0.1 10*3/uL (ref 0.0–0.1)
Basophils Relative: 1 % (ref 0–1)
Monocytes Absolute: 0.5 10*3/uL (ref 0.1–1.0)
Neutro Abs: 4.4 10*3/uL (ref 1.7–7.7)
Neutrophils Relative %: 60 % (ref 43–77)

## 2011-05-21 LAB — COMPREHENSIVE METABOLIC PANEL
AST: 24 U/L (ref 0–37)
Albumin: 3.9 g/dL (ref 3.5–5.2)
Chloride: 101 mEq/L (ref 96–112)
Creatinine, Ser: 1.31 mg/dL (ref 0.50–1.35)
Potassium: 4.4 mEq/L (ref 3.5–5.1)
Total Bilirubin: 0.5 mg/dL (ref 0.3–1.2)
Total Protein: 7.8 g/dL (ref 6.0–8.3)

## 2011-05-21 LAB — CBC
MCHC: 34.3 g/dL (ref 30.0–36.0)
RDW: 13.8 % (ref 11.5–15.5)

## 2011-05-21 LAB — ETHANOL: Alcohol, Ethyl (B): <11 mg/dL (ref 0–11)

## 2011-05-21 NOTE — ED Provider Notes (Signed)
History     CSN: [id number] Arrival date & time: 05/21/2011  5:22 PM   First MD Initiated Contact with Patient 05/21/11 1833      Chief Complaint  Patient presents with  . Hallucinations    (Consider location/radiation/quality/duration/timing/severity/associated sxs/prior treatment) HPI The patient presents with concerns of increasing hallucinations. He notes a history of depression, anxiety, with intermittent compliance with Zoloft. He notes that over the past months he has lost multiple family members and a neighbor. She also describes personal life stress. Prior to approximately 6 months the patient no history of hallucinations. He now notes that he frequently sees history mother and other individuals walking about. He denies significant auditory hallucinations. He denies suicidal ideation but notes that he feels overwhelmed. He also describes paranoid thoughts regarding possible alterations of his food by unknown people, as well as alleging that a rubbery was connected by others, but erroneously attributed to him No past medical history on file.  No past surgical history on file.  No family history on file.  History  Substance Use Topics  . Smoking status: Not on file  . Smokeless tobacco: Not on file  . Alcohol Use: Not on file      Review of Systems  Constitutional:       Per HPI, otherwise negative  HENT:       Per HPI, otherwise negative  Eyes: Negative.   Respiratory:       Per HPI, otherwise negative  Cardiovascular:       Per HPI, otherwise negative  Gastrointestinal: Negative for vomiting.  Genitourinary: Negative.   Musculoskeletal:       Per HPI, otherwise negative  Skin: Negative.   Neurological: Negative for syncope.  Psychiatric/Behavioral: Positive for hallucinations. Negative for self-injury. The patient is nervous/anxious.     Allergies  Review of patient's allergies indicates no known allergies.  Home Medications   Current Outpatient Rx    Name Route Sig Dispense Refill  . GABAPENTIN 300 MG PO CAPS Oral Take 300 mg by mouth 2 (two) times daily.      Marland Kitchen LIDOCAINE 5 % EX PTCH Transdermal Place 1 patch onto the skin daily. Remove & Discard patch within 12 hours or as directed by MD     . LISINOPRIL 10 MG PO TABS Oral Take 10 mg by mouth daily.      Marland Kitchen NAPROXEN 500 MG PO TABS Oral Take 500 mg by mouth 2 (two) times daily as needed. For pain.     Marland Kitchen OMEGA-3-ACID ETHYL ESTERS 1 G PO CAPS Oral Take 2 g by mouth 2 (two) times daily.      . SERTRALINE HCL 50 MG PO TABS Oral Take 50 mg by mouth at bedtime.      Marland Kitchen SIMVASTATIN 40 MG PO TABS Oral Take 40 mg by mouth at bedtime.        BP 129/90  Pulse 69  Temp(Src) 98.4 F (36.9 C) (Oral)  Resp 18  SpO2 96%  Physical Exam  Constitutional: He is oriented to person, place, and time. He appears well-developed and well-nourished.  HENT:  Head: Normocephalic and atraumatic.  Eyes: Conjunctivae are normal. Pupils are equal, round, and reactive to light.  Neck: Neck supple.  Cardiovascular: Normal rate and regular rhythm.   Pulmonary/Chest: No respiratory distress.  Abdominal: Soft. There is no tenderness.  Musculoskeletal: He exhibits no edema.  Neurological: He is alert and oriented to person, place, and time.  Skin: Skin is warm and dry.  Psychiatric: His speech is normal and behavior is normal. Judgment normal. His mood appears anxious. Thought content is paranoid and delusional. Cognition and memory are normal. He exhibits a depressed mood. He expresses no homicidal and no suicidal ideation. He expresses no suicidal plans.    ED Course  Procedures (including critical care time)   Labs Reviewed  CBC  DIFFERENTIAL  COMPREHENSIVE METABOLIC PANEL  URINALYSIS, ROUTINE W REFLEX MICROSCOPIC  ETHANOL  URINE RAPID DRUG SCREEN (HOSP PERFORMED)   No results found.   No diagnosis found.    MDM  This gentleman with depression and anxiety now presents with concerns of  increasing/ongoing hallucinations. On exam the patient is in no distress though he endorses hallucinations, and his speech is demonstrative of paranoid thought pattern. The patient denies suicidal ideation. Given the patient's history of depression, and the notable number of family deaths, this may represent an extreme grief reaction, though there is concern for new psychosis. The patient will be transferred for further evaluation and management by behavioral health        Gerhard Munch, MD 05/21/11 [phone]

## 2011-05-21 NOTE — ED Notes (Signed)
To ed for eval after having hallucinations. States he has been seeing his grandmother who died, seeing his neighbor who 'died in front of him' a cple yrs ago. Pt states he has been under a lot of stress lately. Denies SI right now. States 'i know it's wrong', but i have had feelings of no purpose. Pt is calm and cooperative.

## 2011-05-21 NOTE — ED Notes (Signed)
Pt reports having auditory and visual hallucinations x 6 months.  Has been in treatment for it with no relief.  Pt reports seeing and hearing his grandmother who passed away and also his neighbor who also passed away.  Pt denies HI or SI.  States that the voices are people talking to him nicely, no intent to harm himself.  Skin warm, dry and intact.  Neuro intact.

## 2011-05-21 NOTE — ED Notes (Signed)
Spoke with Dr. Lucrezia Starch, updated MD on pt and pt now doing tele psych consult.

## 2011-05-21 NOTE — ED Notes (Signed)
ACT Team at bedside for pt evaluation.

## 2011-05-21 NOTE — ED Notes (Signed)
Pt has completed tele psych at this time.
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
## 2011-05-22 ENCOUNTER — Encounter (HOSPITAL_COMMUNITY): Payer: Self-pay | Admitting: *Deleted

## 2011-05-22 ENCOUNTER — Inpatient Hospital Stay (HOSPITAL_COMMUNITY): Admission: EM | Admit: 2011-05-22 | Payer: Self-pay

## 2011-05-22 ENCOUNTER — Inpatient Hospital Stay (HOSPITAL_COMMUNITY)
Admission: EM | Admit: 2011-05-22 | Discharge: 2011-05-24 | DRG: 885 | Disposition: A | Discharge status: Home / Self Care | Payer: Medicaid Other | Attending: Psychiatry | Admitting: Psychiatry

## 2011-05-22 DIAGNOSIS — G8929 Other chronic pain: Secondary | ICD-10-CM

## 2011-05-22 DIAGNOSIS — M25569 Pain in unspecified knee: Secondary | ICD-10-CM

## 2011-05-22 DIAGNOSIS — E7800 Pure hypercholesterolemia, unspecified: Secondary | ICD-10-CM

## 2011-05-22 DIAGNOSIS — Z79899 Other long term (current) drug therapy: Secondary | ICD-10-CM

## 2011-05-22 DIAGNOSIS — I10 Essential (primary) hypertension: Secondary | ICD-10-CM

## 2011-05-22 DIAGNOSIS — F341 Dysthymic disorder: Secondary | ICD-10-CM

## 2011-05-22 DIAGNOSIS — G4730 Sleep apnea, unspecified: Secondary | ICD-10-CM

## 2011-05-22 DIAGNOSIS — M25519 Pain in unspecified shoulder: Secondary | ICD-10-CM

## 2011-05-22 DIAGNOSIS — F1210 Cannabis abuse, uncomplicated: Secondary | ICD-10-CM

## 2011-05-22 DIAGNOSIS — F411 Generalized anxiety disorder: Secondary | ICD-10-CM | POA: Diagnosis present

## 2011-05-22 DIAGNOSIS — IMO0002 Reserved for concepts with insufficient information to code with codable children: Secondary | ICD-10-CM

## 2011-05-22 DIAGNOSIS — F333 Major depressive disorder, recurrent, severe with psychotic symptoms: Principal | ICD-10-CM

## 2011-05-22 MED ORDER — MAGNESIUM HYDROXIDE 400 MG/5ML PO SUSP: 30.0000 mL | Freq: Every day | ORAL | Status: DC | PRN

## 2011-05-22 MED ORDER — NAPROXEN 500 MG PO TABS: 500.0000 mg | ORAL_TABLET | Freq: Two times a day (BID) | ORAL | Status: DC | PRN

## 2011-05-22 MED ORDER — GABAPENTIN 600 MG PO TABS
600.0000 mg | ORAL_TABLET | ORAL | Status: DC
  Administered 2011-05-22 – 2011-05-24 (×5): 600 mg via ORAL
  Filled 2011-05-22: qty 21
  Filled 2011-05-22 (×6): qty 1
  Filled 2011-05-22: qty 21
  Filled 2011-05-22: qty 1
  Filled 2011-05-22 (×2): qty 21
  Filled 2011-05-22: qty 1
  Filled 2011-05-22 (×2): qty 21

## 2011-05-22 MED ORDER — ACETAMINOPHEN 325 MG PO TABS: 650.0000 mg | ORAL_TABLET | Freq: Four times a day (QID) | ORAL | Status: DC | PRN

## 2011-05-22 MED ORDER — SIMVASTATIN 40 MG PO TABS
40.0000 mg | ORAL_TABLET | Freq: Every day | ORAL | Status: DC
  Administered 2011-05-22 – 2011-05-23 (×2): 40 mg via ORAL
  Filled 2011-05-22 (×3): qty 1
  Filled 2011-05-22 (×2): qty 3

## 2011-05-22 MED ORDER — OMEGA-3-ACID ETHYL ESTERS 1 G PO CAPS
2.0000 g | ORAL_CAPSULE | ORAL | Status: DC
  Administered 2011-05-22 – 2011-05-23 (×3): 2 g via ORAL
  Filled 2011-05-22 (×2): qty 2
  Filled 2011-05-22 (×2): qty 12
  Filled 2011-05-22 (×2): qty 2
  Filled 2011-05-22: qty 12
  Filled 2011-05-22: qty 2
  Filled 2011-05-22: qty 12
  Filled 2011-05-22: qty 2

## 2011-05-22 MED ORDER — SERTRALINE HCL 100 MG PO TABS
100.0000 mg | ORAL_TABLET | Freq: Every day | ORAL | Status: DC
  Administered 2011-05-22 – 2011-05-24 (×3): 100 mg via ORAL
  Filled 2011-05-22 (×3): qty 1
  Filled 2011-05-22 (×2): qty 14

## 2011-05-22 MED ORDER — ALUM & MAG HYDROXIDE-SIMETH 200-200-20 MG/5ML PO SUSP: 30.0000 mL | ORAL | Status: DC | PRN

## 2011-05-22 MED ORDER — HYDROXYZINE HCL 25 MG PO TABS: 25.0000 mg | ORAL_TABLET | ORAL | Status: DC | PRN

## 2011-05-22 MED ORDER — LISINOPRIL 10 MG PO TABS
10.0000 mg | ORAL_TABLET | Freq: Every day | ORAL | Status: DC
  Administered 2011-05-22 – 2011-05-24 (×3): 10 mg via ORAL
  Filled 2011-05-22: qty 3
  Filled 2011-05-22 (×5): qty 1
  Filled 2011-05-22: qty 3

## 2011-05-22 MED ORDER — ACETAMINOPHEN 325 MG PO TABS
ORAL_TABLET | ORAL | Status: AC
  Administered 2011-05-22: 650 mg
  Filled 2011-05-22: qty 2

## 2011-05-22 MED ORDER — RISPERIDONE 1 MG PO TABS
1.0000 mg | ORAL_TABLET | Freq: Every day | ORAL | Status: DC
  Administered 2011-05-22 – 2011-05-23 (×2): 1 mg via ORAL
  Filled 2011-05-22: qty 1
  Filled 2011-05-22: qty 14
  Filled 2011-05-22: qty 1
  Filled 2011-05-22: qty 14
  Filled 2011-05-22: qty 1
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
## 2011-05-22 NOTE — Progress Notes (Signed)
Patient ID: Thomas Lambert, male   DOB: 1971-04-22, 40 y.o.   MRN: [id number] Pt is voluntary. Pt came in on request of his therapist. Pt stated "I am seeing my died grandma and my neighbor I found dead for the last 6 months." Pt wants help. Pt is pleasant and joking around during admission. Pt has not been on his medications for a while and has recently been in jail for 20 days.  Pt  States "I do drink and smoke weed every now and then."Pt denies SI/HI/.

## 2011-05-22 NOTE — Progress Notes (Signed)
Pt. Was in the dayroom interacting with staff and his peers.  Pt. Stated that he appreciates the staff here at [facility] and it has been a blessing to him to be here.  Pt. Is very grandiose and reports that he has brightened the day of many of the peers on the 400 hall.  Pt. Discusses being in jail and puts the blame on others but states that it changed his life and he is making needed changes.  Pt. Denies SI, HI and AH, but reports that he has been seeing his dead Grandmother,  And neighbor who he states he found dead.    Pt. Was pleasant and cooperative.  Pt. Does talk of chronic back pain which he was scheduled and given neurontin at HS to relieve the pain.  Pt. Reports that this helped in the past.  Pt. Has no other complaints of pain or discomfort noted at this time. Pt. Is now resting quietly.

## 2011-05-22 NOTE — Tx Team (Signed)
Interdisciplinary Treatment Plan Update (Adult)  Date:  05/22/2011  Time Reviewed:  11:11 AM   Progress in Treatment: Attending groups:  Just arrived Participating in groups:    N/A Taking medication as prescribed:    Just arrived Tolerating medication:   N/A Family/Significant other contact made:  Not yet, just arrived Patient understands diagnosis:   Is talking extensively about his depression Discussing patient identified problems/goals with staff:   Yes, fully engaged Medical problems stabilized or resolved:   Unknown, just arrived Denies suicidal/homicidal ideation:  Yes, "I've never wanted to do that." Issues/concerns per patient self-inventory:   Not done today, just arrived. Other:  New problem(s) identified: No, Describe:    Reason for Continuation of Hospitalization: Anxiety Delusions  Depression Hallucinations  Interventions implemented related to continuation of hospitalization:  Medication monitoring and adjustment, safety checks Q15 min., group therapy, psychoeducation, collateral contact  Additional comments:  Extremely strong odor of feces  Estimated length of stay:  Discharge Plan:  Has been getting medications at [facility], but has an appointment to go to [facility].    New goal(s):  Not applicable  Review of initial/current patient goals per problem list:   1.  Goal(s):  Determine extent of symptoms, what is real and what is delusion/hallucinations  Met:  No  Target date:  By Discharge   As evidenced by:  Talking to patient currently to determine extent, will also seek collateral contact; short temper, depression  2.  Goal(s):  Reduce psychosis to baseline  Met:  No  Target date:  By Discharge   As evidenced by:  Visual hallucinations and voices  3.  Goal(s):  Return sleep to normal pattern  Met:  No  Target date:  By Discharge   As evidenced by:  "Not at all" when asked if is sleeping  4.  Goal(s):  Reduce depression to  3 at the most.  Met:  No  Target date:  By Discharge   As evidenced by:  Today is at 8, a little less hopeless than yesterday  Attendees: Patient:  Thomas Lambert  05/22/2011  11:11 AM   Family:     Physician:  Dr. Harvie Heck Readling 05/22/2011  11:11 AM   Nursing:   Robbie Louis, RN 05/22/2011  11:11 AM   Case Manager:  Ambrose Mantle, LCSW 05/22/2011  11:11 AM   Counselor:        Other:   Other:     Other:     Other:      Scribe for Treatment Team:   Sarina Ser, 05/22/2011, 11:11 AM
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
## 2011-05-22 NOTE — ED Provider Notes (Signed)
730 am pt alert , ambulatory ,pleassant cooperative, gcs 15  Plan transfer to [facility] dr readling acepting mD  Doug Sou, MD 05/22/11 [phone]

## 2011-05-23 LAB — T3, FREE: T3, Free: 2.9 pg/mL (ref 2.3–4.2)

## 2011-05-23 LAB — T4, FREE: Free T4: 0.98 ng/dL (ref 0.80–1.80)

## 2011-05-23 NOTE — Discharge Planning (Signed)
With Thomas Lambert's verbal consent in front of MHTs Noralee Stain, Case Manager called mother Thomas Lambert [phone].  She states that she has spoken by phone with Thomas Lambert today and feels he is back to his normal self.  She has no concerns about discharge tomorrow.  She states that he takes his medication regularly.  His uncle or brother can come to pick him up.  His "spirits seem greatly improved".  She states he was not suicidal, and Case Manager can find no evidence in the record of SI.  Mother feels Thomas Lambert is ready for discharge.  Ambrose Mantle, LCSW 05/23/2011, 6:29 PM

## 2011-05-23 NOTE — Progress Notes (Signed)
[facility] Group Notes:  (Counselor/Nursing/MHT/Case Management/Adjunct)  05/23/2011 4:48 PM  Type of Therapy:  Group therapy  Participation Level:  Active  Participation Quality:  Appropriate  Affect:  Appropriate  Cognitive:  Appropriate  Insight:  Limited  Engagement in Group:  Limited  Engagement in Therapy:  Limited  Modes of Intervention:  Clarification, Education, Limit-setting, Orientation, Problem-solving, Socialization and Support  Summary of Progress/Problems: Pt actively participated in group focused on identifying problem areas.  Pt stated that he was doing well and praised the staff.  Pt gave feedback to other.  Progress noted.   Christen Butter 05/23/2011, 4:48 PM

## 2011-05-23 NOTE — Progress Notes (Signed)
Pt. Pleasant and cooperative.  Interacting with peers.  Excited about attending Karaoke.  Reports that when he gets out of the hospital he will let his mother give his medications to him at the appropriate times.  Denies SI/HI  And denies A/V hallucinations.

## 2011-05-23 NOTE — Progress Notes (Signed)
Pt's belongings are locked up in locker 18

## 2011-05-23 NOTE — Discharge Planning (Signed)
Met with patient in Aftercare Planning Group.   He was expansive about his positive experience at [facility].  He stated that he lives with his mother, and sees counselor Sabas Sous at [facility] [place], [phone], ext [phone].  He has been getting services at [facility], and hopes that they will be able to manage his medication.  If not, can go to [facility] for same.  Has an appointment at [facility] on 05/30/11.  No case management needs today.  Ambrose Mantle, LCSW 05/23/2011, 1:34 PM

## 2011-05-23 NOTE — Progress Notes (Signed)
[facility] Group Notes:  (Counselor/Nursing/MHT/Case Management/Adjunct)  05/23/2011 5:00 PM  Type of Therapy:  Psychoeducational Skills  Participation Level:  Active  Participation Quality:  Appropriate  Affect:  Anxious  Cognitive:  Appropriate  Insight:  Limited  Engagement in Group:  Limited  Engagement in Therapy:  Limited  Modes of Intervention:  Activity, clarification and support.  Summary of Progress/Problems: Pt actively participated in group which was focused on coping skills.  Therapist prompted Pt to identify any problem areas.  Therapist facilitated a didactic lecture on the importance of incorporating positive activities to maintain a balanced lifestyle, including music and humor.   Pt joined in the singing and laughter. Progress noted.    Marni Griffon C 05/23/2011, 5:00 PM

## 2011-05-23 NOTE — Progress Notes (Signed)
Patient up and attending all groups today.  Interacting well with staff and peers.  Denies suicidal ideation or thoughts of self harm.  Also denies having any auditory or visual hallucinations since coming to the unit.  Has been pleasant and cooperative thus far.  Appetite good.

## 2011-05-23 NOTE — Progress Notes (Signed)
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  Generalized Anxiety Disorder.  Cannabis Abuse.   ADL's: Good.  Sleep: The patient reports to sleeping well last night Appetite: The patient reports a good appetite today.   Mild>(1-10) >Severe  Hopelessness (1-10): 0  Depression (1-10): 0  Anxiety (1-10): 0   Suicidal Ideation: The patient adamantly denies any suicidal ideations today.   Plan: No  Intent: No  Means: No   Homicidal Ideation: The patient reports some vague homicidal thoughts toward a person who reportedly laced his drink with cocaine. However, he states he would never harm the person due to "not wanting to destroy my life." Plan: No  Intent: No.  Means: No   General Appearance /Behavior: Neat in appearance.  Eye Contact: Good.  Speech: Slightly Pressured.  Motor Behavior: WNL.  Level of Consciousness: Alert  Mental Status: Alert and Oriented x 3.  Mood: Depressed - Euthymic Today.  Affect: Bright and Full.  Anxiety Level: Good Control today.  Thought Process: wnl.  Thought Content: The patient denies any current auditory or visual hallucinations today as well as any delusional thinking. Perception: Good.  Judgment: Good  Insight: Good.  Cognition: Appropriate.  Sleep:  Number of Hours: 5.5   Vital Signs:Blood pressure 145/91, pulse 72, temperature 98 F (36.7 C), temperature source Oral, resp. rate 15, height 6' 1.2" (1.859 m), weight 95.709 kg (211 lb).  Lab Results:  Results for orders placed during the hospital encounter of 05/22/11 (from the past 48 hour(s))  TSH     Status: Normal   Collection Time   05/22/11  8:12 PM      Component Value Range Comment   TSH 1.428  0.350 - 4.500 (uIU/mL)   T3, FREE     Status: Normal   Collection Time   05/22/11  8:12 PM      Component Value Range Comment   T3, Free 2.9  2.3 - 4.2 (pg/mL)   T4, FREE     Status: Normal   Collection Time   05/22/11  8:12 PM      Component Value Range Comment   Free T4 0.98  0.80 - 1.80 (ng/dL)    Treatment Plan Summary:  1. Daily contact with patient to assess and evaluate symptoms and progress in treatment  2. Medication management  3. The patient will deny suicidal ideations or homicidal ideations for 48 hours prior to discharge and have a depression and anxiety rating of 3 or less. The patient will also deny any auditory or visual hallucinations or delusional thinking and display no manic or hypomanic behaviors.   Plan:  1. Will continue current medications. 2. Will continue to monitor. 3. Possible discharge in morning if symptoms remain under good control.  Jacion Dismore 05/23/2011, 1:44 PM

## 2011-05-24 MED ORDER — SIMVASTATIN 40 MG PO TABS: 40.0000 mg | ORAL_TABLET | Freq: Every day | ORAL | Status: DC

## 2011-05-24 MED ORDER — RISPERIDONE 1 MG PO TABS: 1.0000 mg | ORAL_TABLET | Freq: Every day | ORAL | Status: AC

## 2011-05-24 MED ORDER — OMEGA-3-ACID ETHYL ESTERS 1 G PO CAPS: 2.0000 g | ORAL_CAPSULE | Freq: Two times a day (BID) | ORAL | Status: DC

## 2011-05-24 MED ORDER — SERTRALINE HCL 100 MG PO TABS: 100.0000 mg | ORAL_TABLET | Freq: Every day | ORAL | Status: DC

## 2011-05-24 MED ORDER — GABAPENTIN 600 MG PO TABS: 600.0000 mg | ORAL_TABLET | ORAL | Status: DC

## 2011-05-24 MED ORDER — LISINOPRIL 10 MG PO TABS: 10.0000 mg | ORAL_TABLET | Freq: Every day | ORAL | Status: AC

## 2011-05-24 NOTE — Discharge Summary (Signed)
Discharge Note  Patient:  Thomas Lambert is an 40 y.o., male DOB:  [birth year]  Date of Admission:  05/22/2011  Date of Discharge:  05/24/2011  Demographic factors:  Assessment Details Time of Assessment: Admission Information Obtained From: Patient Current Mental Status:  Current Mental Status:  (Denies SI/HI) Risk Reduction Factors:  Risk Reduction Factors: Sense of responsibility to family;Positive therapeutic relationship;Positive social support  CLINICAL FACTORS:   Severe Anxiety and/or Agitation Depression:   Anhedonia Delusional Insomnia More than one psychiatric diagnosis Previous Psychiatric Diagnoses and Treatments Medical Diagnoses and Treatments/Surgeries  COGNITIVE FEATURES THAT CONTRIBUTE TO RISK:  None  Diagnoses:  Axis I: 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  Generalized Anxiety Disorder.  Cannabis Abuse.   ADL's: Good.  Sleep: The patient reports to sleeping very well last night  Appetite: The patient reports a good appetite today.   Mild>(1-10) >Severe  Hopelessness (1-10): 0  Depression (1-10): 0  Anxiety (1-10): 0   Suicidal Ideation: The patient adamantly denies any suicidal ideations today.  Plan: No  Intent: No  Means: No   Homicidal Ideation: The patient adamantly denies suicidal ideations today. Plan: No  Intent: No.  Means: No   General Appearance /Behavior: Neat in appearance.  Eye Contact: Good.  Speech: Slightly Pressured.  Motor Behavior: WNL.  Level of Consciousness: Alert  Mental Status: Alert and Oriented x 3.  Mood: Depressed - Euthymic Today.  Affect: Bright and Full.  Anxiety Level: Good Control today.  Thought Process: WNL.  Thought Content: The patient denies any current auditory or visual hallucinations today as well as any delusional thinking.  Perception: Good.  Judgment: Good  Insight: Good.  Cognition: Appropriate.   Treatment Plan Summary:  1. Daily contact with patient to assess  and evaluate symptoms and progress in treatment  2. Medication management  3. The patient will deny suicidal ideations or homicidal ideations for 48 hours prior to discharge and have a depression and anxiety rating of 3 or less. The patient will also deny any auditory or visual hallucinations or delusional thinking and display no manic or hypomanic behaviors.   Plan:  1. Will continue current medications.  2. Will continue to monitor.  3. Discharge today to outpatient follow up.   Majel Homer D  Home Medication Instructions [id number]   Printed on:05/24/11 1006  Medication Information                    lidocaine (LIDODERM) 5 % Place 1 patch onto the skin daily. Remove & Discard patch within 12 hours or as directed by MD            naproxen (NAPROSYN) 500 MG tablet Take 500 mg by mouth 2 (two) times daily as needed. For pain.            gabapentin (NEURONTIN) 600 MG tablet Take 1 tablet (600 mg total) by mouth 3 (three) times daily at 8am, 2pm and bedtime. For anxiety           lisinopril (PRINIVIL,ZESTRIL) 10 MG tablet Take 1 tablet (10 mg total) by mouth daily. For blood pressure           omega-3 acid ethyl esters (LOVAZA) 1 G capsule Take 2 capsules (2 g total) by mouth 2 (two) times daily. For heart health           risperiDONE (RISPERDAL) 1 MG tablet Take 1 tablet (1 mg total) by mouth at bedtime. For clear thoughts  sertraline (ZOLOFT) 100 MG tablet Take 1 tablet (100 mg total) by mouth daily. For depression           simvastatin (ZOCOR) 40 MG tablet Take 1 tablet (40 mg total) by mouth at bedtime. For lowering cholesterol            Level of Care:  OP  Discharge destination:  Home  Is patient on multiple antipsychotic therapies at discharge:  No    Has Patient had three or more failed trials of antipsychotic monotherapy by history:  No  Patient phone:  [phone] (home)  Patient address:   [address] [place]  [address],   Follow-up recommendations:  Other:  Please take all medications only as directed and keep all follow-up appointments.  The patient received suicide prevention pamphlet:  No  Arnetta Odeh 05/24/2011, 10:04 AM

## 2011-05-24 NOTE — Discharge Summary (Signed)
**Note De-Lambert via Obfuscation** Patient:  Thomas Lambert is an 40 y.o., male DOB:  Jul 27, 1970  Date of Admission:  05/22/2011  Date of Discharge:  05/24/2011  Demographic factors:  Assessment Details Time of Assessment: Admission Information Obtained From: Patient Current Mental Status:  Current Mental Status:  (Denies SI/HI) Risk Reduction Factors:  Risk Reduction Factors: Sense of responsibility to family;Positive therapeutic relationship;Positive social support  CLINICAL FACTORS:   Severe Anxiety and/or Agitation Depression:   Anhedonia Delusional Insomnia More than one psychiatric diagnosis Previous Psychiatric Diagnoses and Treatments Medical Diagnoses and Treatments/Surgeries  COGNITIVE FEATURES THAT CONTRIBUTE TO RISK:  None  Diagnoses:  Axis I: 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  Generalized Anxiety Disorder.  Cannabis Abuse.   Hospital Course:  The patient is a 40 year old AA male who was admitted to [facility] on 05/22/11 for treatment of depression, anxiety, vague HI and visual hallucinations of seeing his deceased Grandmother.  The patient was started on the medications Zoloft for depression, Neurontin for anxiety and Risperdal for his psychotic symptoms.  The patient responded very well to these medications and today is stable and ready for discharge to outpatient follow-up.  He agrees to take all medications as prescribed and abstain from any further use of alcohol or illicit drugs.  He also agrees to keep all of his scheduled follow-up appointments.  Condition At Discharge:  See Below.  ADL's: Good.  Sleep: The patient reports to sleeping very well last night  Appetite: The patient reports a good appetite today.   Mild>(1-10) >Severe  Hopelessness (1-10): 0  Depression (1-10): 0  Anxiety (1-10): 0   Suicidal Ideation: The patient adamantly denies any suicidal ideations today.  Plan: Thomas  Intent: Thomas  Means: Thomas   Homicidal Ideation: The patient adamantly denies  suicidal ideations today. Plan: Thomas  Intent: Thomas.  Means: Thomas   General Appearance /Behavior: Neat in appearance.  Eye Contact: Good.  Speech: Slightly Pressured.  Motor Behavior: WNL.  Level of Consciousness: Alert  Mental Status: Alert and Oriented x 3.  Mood: Depressed - Euthymic Today.  Affect: Bright and Full.  Anxiety Level: Good Control today.  Thought Process: WNL.  Thought Content: The patient denies any current auditory or visual hallucinations today as well as any delusional thinking.  Perception: Good.  Judgment: Good  Insight: Good.  Cognition: Appropriate.   Treatment Plan Summary:  1. Daily contact with patient to assess and evaluate symptoms and progress in treatment  2. Medication management  3. The patient will deny suicidal ideations or homicidal ideations for 48 hours prior to discharge and have a depression and anxiety rating of 3 or less. The patient will also deny any auditory or visual hallucinations or delusional thinking and display Thomas manic or hypomanic behaviors.   Plan:  1. Will continue current medications.  2. Will continue to monitor.  3. Discharge today to outpatient follow up.   Majel Homer D  Home Medication Instructions [id number]   Printed on:05/24/11 1006  Medication Information                    lidocaine (LIDODERM) 5 % Place 1 patch onto the skin daily. Remove & Discard patch within 12 hours or as directed by MD            naproxen (NAPROSYN) 500 MG tablet Take 500 mg by mouth 2 (two) times daily as needed. For pain.            gabapentin (  NEURONTIN) 600 MG tablet Take 1 tablet (600 mg total) by mouth 3 (three) times daily at 8am, 2pm and bedtime. For anxiety           lisinopril (PRINIVIL,ZESTRIL) 10 MG tablet Take 1 tablet (10 mg total) by mouth daily. For blood pressure           omega-3 acid ethyl esters (LOVAZA) 1 G capsule Take 2 capsules (2 g total) by mouth 2 (two) times daily. For heart health             risperiDONE (RISPERDAL) 1 MG tablet Take 1 tablet (1 mg total) by mouth at bedtime. For clear thoughts           sertraline (ZOLOFT) 100 MG tablet Take 1 tablet (100 mg total) by mouth daily. For depression           simvastatin (ZOCOR) 40 MG tablet Take 1 tablet (40 mg total) by mouth at bedtime. For lowering cholesterol            Level of Care:  OP  Discharge destination:  Home  Is patient on multiple antipsychotic therapies at discharge:  Thomas    Has Patient had three or more failed trials of antipsychotic monotherapy by history:  Thomas  Patient phone:  [phone] (home)  Patient address:   [name] [place] [address],   Follow-up recommendations:  Other:  Please take all medications only as directed and keep all follow-up appointments.  The patient received suicide prevention pamphlet:  Thomas

## 2011-05-24 NOTE — Progress Notes (Signed)
Patient discharged from [facility] w/samples of meds, prescriptions, signed appropriate forms for medical release, went over meds and how to take them, went over appointments, returned lockedup belongings to patient, denies Si or Hi and states he is feeling much better, wants to get home to mother to see how she is. SL RN

## 2011-05-24 NOTE — Progress Notes (Signed)
Suicide Risk Assessment  Discharge Assessment     Demographic factors:  Assessment Details Time of Assessment: Admission Information Obtained From: Patient Current Mental Status:  Current Mental Status:  (Denies SI/HI) Risk Reduction Factors:  Risk Reduction Factors: Sense of responsibility to family;Positive therapeutic relationship;Positive social support  CLINICAL FACTORS:   Severe Anxiety and/or Agitation Depression:   Anhedonia Delusional Insomnia More than one psychiatric diagnosis Previous Psychiatric Diagnoses and Treatments Medical Diagnoses and Treatments/Surgeries  COGNITIVE FEATURES THAT CONTRIBUTE TO RISK:  None  Diagnoses:  Axis I: 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  Generalized Anxiety Disorder.  Cannabis Abuse.   ADL's: Good.  Sleep: The patient reports to sleeping very well last night  Appetite: The patient reports a good appetite today.   Mild>(1-10) >Severe  Hopelessness (1-10): 0  Depression (1-10): 0  Anxiety (1-10): 0   Suicidal Ideation: The patient adamantly denies any suicidal ideations today.  Plan: No  Intent: No  Means: No   Homicidal Ideation: The patient adamantly denies suicidal ideations today. Plan: No  Intent: No.  Means: No   General Appearance /Behavior: Neat in appearance.  Eye Contact: Good.  Speech: Slightly Pressured.  Motor Behavior: WNL.  Level of Consciousness: Alert  Mental Status: Alert and Oriented x 3.  Mood: Depressed - Euthymic Today.  Affect: Bright and Full.  Anxiety Level: Good Control today.  Thought Process: WNL.  Thought Content: The patient denies any current auditory or visual hallucinations today as well as any delusional thinking.  Perception: Good.  Judgment: Good  Insight: Good.  Cognition: Appropriate.   Treatment Plan Summary:  1. Daily contact with patient to assess and evaluate symptoms and progress in treatment  2. Medication management  3. The patient will  deny suicidal ideations or homicidal ideations for 48 hours prior to discharge and have a depression and anxiety rating of 3 or less. The patient will also deny any auditory or visual hallucinations or delusional thinking and display no manic or hypomanic behaviors.   Plan:  1. Will continue current medications.  2. Will continue to monitor.  3. Discharge today to outpatient follow up.  SUICIDE RISK:   Minimal: No identifiable suicidal ideation.  Patients presenting with no risk factors but with morbid ruminations; may be classified as minimal risk based on the severity of the depressive symptoms  Thomas Lambert 05/24/2011, 9:59 AM

## 2011-05-24 NOTE — Progress Notes (Signed)
[facility] Group Notes:  (Counselor/Nursing/MHT/Case Management/Adjunct)  05/24/2011 12:53 PM  Type of Therapy:  Group therapy  Participation Level:  Active  Participation Quality:  Appropriate, Attentive and Sharing  Affect:  Appropriate  Cognitive:  Appropriate  Insight:  Good  Engagement in Group:  Good  Engagement in Therapy:  Good  Modes of Intervention:  Education and Support  Summary of Progress/Problems: Patient stated that he is being discharged today. Stated that he feels better and was very complimentary of the hospital. Stated he realizes that he needs to take his medications so he doesn't get depressed and his mood stays stable. Very supportive of peers and gave good feedback.    HartisAram Beecham 05/24/2011, 12:53 PM

## 2011-05-24 NOTE — Progress Notes (Signed)
Patient resting quietly with eyes closed. Respirations even and unlabored. No distress noted.

## 2011-05-24 NOTE — Progress Notes (Signed)
Up to DR for meals today and appetite is good, slept fair last nite, energy level is normal and ability to pay attention is improving, depressed 3/10 and hopeless 2/10, denies Si or HI. Attending group. q15min safety checks continue and support offered Safety maintained   SLL RN

## 2011-05-24 NOTE — Tx Team (Signed)
Interdisciplinary Treatment Plan Update (Adult)  Date:  05/24/2011  Time Reviewed:  9:50 AM   Progress in Treatment: Attending groups:  Yes Participating in groups:    Fully engaged Taking medication as prescribed:    Yes, no refusals Tolerating medication:   Yes, feels medications are helping a great deal, no side effects reported or noted Family/Significant other contact made:  Yes, with mother Patient understands diagnosis:   Yes, expresses clear understanding of his depression, although not grandiosity Discussing patient identified problems/goals with staff:   Yes, complete disclosure Medical problems stabilized or resolved:   N/A Denies suicidal/homicidal ideation:  Yes Issues/concerns per patient self-inventory:   None Other:  New problem(s) identified: No, Describe:  No new problems  Reason for Continuation of Hospitalization: None  Interventions implemented related to continuation of hospitalization:  None  Additional comments:  Not applicable  Estimated length of stay:  Discharge Today  Discharge Plan:  Will return home by bus (bus ticket given) to live with mother, who is eager to have him home and believes he is back to his normal self.  Will follow up at [facility], [address], and [employer] [facility] [employer]):  Not applicable  Review of initial/current patient goals per problem list:   1.  Goal(s):  Determine extent of symptoms, what is real and what is delusion/hallucinations.  Met:  Yes  Target date:  By Discharge   As evidenced by:  Through work with patient, assessment of his symptoms, and collateral contact, it is felt that we have a good grasp of patient's symptomology and baseline.  2.  Goal(s):  Reduce psychosis to baseline.  Met:  Yes  Target date:  By Discharge   As evidenced by:  States he is no longer seeing his grandmother or neighbor  3.  Goal(s):  Return sleep to normal pattern.  Met:  Yes  Target date:  By  Discharge   As evidenced by:  Slept 5.5 hours last night  4.  Goal(s):  Reduce depression to no more than 3.  Met:  Yes  Target date:  By Discharge   As evidenced by:  Denies depression today  Attendees: Patient:  Thomas Lambert  05/24/2011  9:50 AM   Family:     Physician:  Dr. Harvie Heck Readling 05/24/2011  9:50 AM   Nursing:   Omelia Blackwater, RN, BSN 05/24/2011  9:50 AM   Case Manager:  Ambrose Mantle, LCSW 05/24/2011  9:50 AM   Counselor:  Veto Kemps, MT-BC 05/24/2011  9:50 AM   Other:  Alease Frame, RN, MSN, [facility] 05/24/2011  9:50 AM  Other:     Other:     Other:      Scribe for Treatment Team:   Sarina Ser, 05/24/2011, 9:50 AM

## 2011-05-24 NOTE — Progress Notes (Signed)
[facility] Case Management Discharge Plan:  Will you be returning to the same living situation after discharge: Yes,  lives with mother Would you like a referral for services when you are discharged:Yes,  will go back to [facility] for counseling, and already had an appointment with [facility], which has confirmed they are comfortable with prescribing the Risperdal Do you have access to transportation at discharge:Yes,  bus pass provided Do you have the ability to pay for your medications:Yes,  given a supply plus has insurance  Interagency Information:   Release of Information for [facility] and [facility] completed to be signed by patient at discharge.  Patient to Follow up at:  Follow-up Information    Follow up with [facility] on 05/30/2011. (11:15 appointment with Dr. Philipp Deputy)    Contact information:   [address] [place] [phone]      Call [facility] (To set next appointment)    Contact information:   [facility] [place] [address] [place] [phone] ext. [phone]         Patient denies SI/HI:   Yes,      Safety Planning and Suicide Prevention discussed:  Yes,  During Aftercare Planning Group, Case Manager provided psychoeducation on "Suicide Prevention Information."  This included descriptions of risk factors for suicide, warning signs that an individual is in crisis and thinking of suicide, and what to do if this occurs.  Pt indicated understanding of information provided, and will read brochure given upon discharge.     Barrier to discharge identified:No.  Summary and Recommendations:  There are no remaining barriers to discharge.   Sarina Ser 05/24/2011, 12:10 PM

## 2011-05-27 NOTE — Progress Notes (Signed)
Patient Discharge Instructions:  Admission Note Faxed,  05/27/2011 Discharge Note Faxed,   05/27/2011 After Visit Summary Faxed,  05/27/2011 Faxed to the Next Level Care provider:  05/27/2011 Facesheet faxed 05/27/2011  Faxed to [facility] @ [phone] And to [facility] [place] @ [phone]   Wandra Scot, 05/27/2011, 5:52 PM

## 2014-07-13 ENCOUNTER — Ambulatory Visit: Payer: Medicaid Other | Attending: Internal Medicine

## 2014-09-12 ENCOUNTER — Ambulatory Visit: Payer: Self-pay | Admitting: Family Medicine

## 2014-09-13 ENCOUNTER — Ambulatory Visit: Payer: Self-pay | Admitting: Family Medicine

## 2014-09-21 ENCOUNTER — Ambulatory Visit: Payer: Self-pay | Admitting: Family Medicine

## 2015-12-05 ENCOUNTER — Emergency Department (HOSPITAL_COMMUNITY): Payer: Medicaid Other

## 2015-12-05 ENCOUNTER — Encounter (HOSPITAL_COMMUNITY): Payer: Self-pay | Admitting: *Deleted

## 2015-12-05 ENCOUNTER — Emergency Department (HOSPITAL_COMMUNITY)
Admission: EM | Admit: 2015-12-05 | Discharge: 2015-12-05 | Disposition: A | Discharge status: Home / Self Care | Payer: Medicaid Other | Attending: Emergency Medicine | Admitting: Emergency Medicine

## 2015-12-05 DIAGNOSIS — R600 Localized edema: Secondary | ICD-10-CM | POA: Insufficient documentation

## 2015-12-05 DIAGNOSIS — Y999 Unspecified external cause status: Secondary | ICD-10-CM | POA: Insufficient documentation

## 2015-12-05 DIAGNOSIS — W109XXA Fall (on) (from) unspecified stairs and steps, initial encounter: Secondary | ICD-10-CM | POA: Insufficient documentation

## 2015-12-05 DIAGNOSIS — Y929 Unspecified place or not applicable: Secondary | ICD-10-CM | POA: Diagnosis not present

## 2015-12-05 DIAGNOSIS — Y939 Activity, unspecified: Secondary | ICD-10-CM | POA: Insufficient documentation

## 2015-12-05 DIAGNOSIS — M25512 Pain in left shoulder: Secondary | ICD-10-CM | POA: Diagnosis not present

## 2015-12-05 DIAGNOSIS — I10 Essential (primary) hypertension: Secondary | ICD-10-CM | POA: Insufficient documentation

## 2015-12-05 DIAGNOSIS — M79645 Pain in left finger(s): Secondary | ICD-10-CM | POA: Insufficient documentation

## 2015-12-05 DIAGNOSIS — W19XXXA Unspecified fall, initial encounter: Secondary | ICD-10-CM

## 2015-12-05 MED ORDER — METHOCARBAMOL 500 MG PO TABS
1000.0000 mg | ORAL_TABLET | Freq: Once | ORAL | Status: AC
  Administered 2015-12-05: 1000 mg via ORAL
  Filled 2015-12-05: qty 2

## 2015-12-05 MED ORDER — HYDROCODONE-ACETAMINOPHEN 5-325 MG PO TABS
1.0000 | ORAL_TABLET | Freq: Once | ORAL | Status: AC
  Administered 2015-12-05: 1 via ORAL
  Filled 2015-12-05: qty 1

## 2015-12-05 MED ORDER — METHOCARBAMOL 500 MG PO TABS: 500.0000 mg | ORAL_TABLET | Freq: Two times a day (BID) | ORAL | Status: DC

## 2015-12-05 MED ORDER — NAPROXEN 250 MG PO TABS
500.0000 mg | ORAL_TABLET | Freq: Once | ORAL | Status: DC
  Filled 2015-12-05: qty 2

## 2015-12-05 NOTE — ED Provider Notes (Signed)
CSN: [id number]     Arrival date & time 12/05/15  1955 History  By signing my name below, I, Soijett Blue, attest that this documentation has been prepared under the direction and in the presence of S. Lane HackerNicole Haeden Hudock, PA-C Electronically Signed: Soijett Blue, ED Scribe. 12/05/2015. 9:41 PM.    Chief Complaint  Patient presents with  . Fall      The history is provided by the patient. No language interpreter was used.    Thomas Lambert is a 45 y.o. male who presents to the Emergency Department complaining of fall occurring 2 hours ago PTA. He notes that he was going up steps when his left knee gave out and he fell and caught himself with his outstretched left arm. Pt reports that he heard and felt a pop to his left arm during the incident. Pt is having associated symptoms of left thumb pain. He reports that his left thumb pain that radiates to his left shoulder upon palpation. He notes that he has not tried any medications for the relief of his symptoms. He denies hitting his head, LOC, gait problem, and any other symptoms.    Past Medical History  Diagnosis Date  . Depression   . Shoulder pain, bilateral   . Knee pain, left   . Back pain   . Degenerative disc disease   . Hypercholesteremia   . Condyloma acuminata   . Sleep apnea   . Hypertension    History reviewed. No pertinent past surgical history. No family history on file. Social History  Substance Use Topics  . Smoking status: Never Smoker   . Smokeless tobacco: None  . Alcohol Use: Yes    Review of Systems  A complete 10 system review of systems was obtained and all systems are negative except as noted in the HPI and PMH.   Allergies  Review of patient's allergies indicates no known allergies.  Home Medications   Prior to Admission medications   Medication Sig Start Date End Date Taking? Authorizing Provider  gabapentin (NEURONTIN) 600 MG tablet Take 1 tablet (600 mg total) by mouth 3 (three) times daily at 8am,  2pm and bedtime. For anxiety 05/24/11 05/23/12  Viviann SpareMargaret A Scott, FNP  lisinopril (PRINIVIL,ZESTRIL) 10 MG tablet Take 1 tablet (10 mg total) by mouth daily. For blood pressure 05/24/11   Viviann SpareMargaret A Scott, FNP  naproxen (NAPROSYN) 500 MG tablet Take 500 mg by mouth 2 (two) times daily as needed. For pain.     Historical Provider, MD  omega-3 acid ethyl esters (LOVAZA) 1 G capsule Take 2 capsules (2 g total) by mouth 2 (two) times daily. For heart health 05/24/11   Viviann SpareMargaret A Scott, FNP  sertraline (ZOLOFT) 100 MG tablet Take 1 tablet (100 mg total) by mouth daily. For depression 05/24/11 05/23/12  Viviann SpareMargaret A Scott, FNP  simvastatin (ZOCOR) 40 MG tablet Take 1 tablet (40 mg total) by mouth at bedtime. For lowering cholesterol 05/24/11   Viviann SpareMargaret A Scott, FNP   BP 145/97 mmHg  Pulse 63  Temp(Src) 97.9 F (36.6 C) (Oral)  Resp 20  Ht 6\' 1"  (1.854 m)  Wt 205 lb (92.987 kg)  BMI 27.05 kg/m2  SpO2 98% Physical Exam  Constitutional: He is oriented to person, place, and time. He appears well-developed and well-nourished. No distress.  HENT:  Head: Normocephalic and atraumatic.  Eyes: EOM are normal.  Neck: Neck supple.  Cardiovascular: Normal rate.   Pulmonary/Chest: Effort normal. No respiratory distress.  Abdominal:  He exhibits no distension.  Musculoskeletal:       Left shoulder: He exhibits tenderness.       Left hand: He exhibits decreased range of motion (due to pain), tenderness and swelling.  Edema at the base of left thumb. Left thumb with decreased ROM due to pain. Positive snuff box tenderness. Diffuse tenderness over left hand. Diffuse tenderness at left shoulder.   Neurological: He is alert and oriented to person, place, and time.  Skin: Skin is warm and dry.  Psychiatric: He has a normal mood and affect. His behavior is normal.  Nursing note and vitals reviewed.   ED Course  Procedures DIAGNOSTIC STUDIES: Oxygen Saturation is 98% on RA, nl by my interpretation.     COORDINATION OF CARE: 9:37 PM Discussed treatment plan with pt at bedside which includes naprosyn, robaxin, left hand xray, left shoulder xray, and pt agreed to plan.   Imaging Review Dg Shoulder Left  12/05/2015  CLINICAL DATA:  Pain after fall. EXAM: LEFT SHOULDER - 2+ VIEW COMPARISON:  None. FINDINGS: No fracture or dislocation.  Mild degenerative changes. IMPRESSION: Negative. Electronically Signed   By: Gerome Samavid  Williams III M.D   On: 12/05/2015 22:22   Dg Hand Complete Left  12/05/2015  CLINICAL DATA:  45 year old male with fall on outstretched hand. EXAM: LEFT HAND - COMPLETE 3+ VIEW COMPARISON:  Left wrist radiograph dated 08/15/2008 FINDINGS: There is no acute fracture or dislocation. Old healed fractures of the first metacarpal. The bones are well mineralized. The soft tissues appear unremarkable. IMPRESSION: No acute fracture or dislocation. Electronically Signed   By: Elgie CollardArash  Radparvar M.D.   On: 12/05/2015 22:24   I have personally reviewed and evaluated these images as part of my medical decision-making.    MDM   Final diagnoses:  Fall, initial encounter    Patient presents s/p fall. Patient X-Ray negative for obvious fracture or dislocation.  Pt advised to follow up with orthopedics. Patient given splint while in ED. Will discharge with robaxin. Informed patient he may need repeat xray in 1 week given snuffbox tenderness. Conservative therapy recommended and discussed. Patient will be discharged home & is agreeable with above plan. Returns precautions discussed. Pt appears safe for discharge.   I personally performed the services described in this documentation, which was scribed in my presence. The recorded information has been reviewed and is accurate.    Melton KrebsSamantha Nicole Feras Gardella, PA-C 12/11/15 1725  Lavera Guiseana Duo Liu, MD 12/12/15 [phone]

## 2015-12-05 NOTE — ED Notes (Signed)
Pt states he was going up the steps and his left knee gave out on him, caught himself with his left arm, heard/felt a pop in his arm. Hurting in left thumb, pain radiates up through left shoulder.

## 2015-12-05 NOTE — Discharge Instructions (Signed)
Mr. Thomas Lambert,  Nice meeting you! Please follow-up with your primary care provider. Return to the emergency department if you develop increased pain, shortness of breath, chest pain, inability to move your arm/hand, new/worsening symptoms. Feel better soon!  S. Lane HackerNicole Dorean Hiebert, PA-C

## 2016-07-04 ENCOUNTER — Other Ambulatory Visit: Payer: Self-pay | Admitting: Orthopedic Surgery

## 2016-07-04 DIAGNOSIS — M542 Cervicalgia: Secondary | ICD-10-CM

## 2016-07-04 DIAGNOSIS — M545 Low back pain: Secondary | ICD-10-CM

## 2016-07-23 ENCOUNTER — Inpatient Hospital Stay: Admission: RE | Admit: 2016-07-23 | Payer: Medicaid Other | Source: Ambulatory Visit

## 2016-07-23 ENCOUNTER — Other Ambulatory Visit: Payer: Medicaid Other

## 2016-08-02 ENCOUNTER — Inpatient Hospital Stay: Admission: RE | Admit: 2016-08-02 | Payer: Medicaid Other | Source: Ambulatory Visit

## 2016-08-02 ENCOUNTER — Other Ambulatory Visit: Payer: Medicaid Other

## 2016-08-10 ENCOUNTER — Other Ambulatory Visit: Payer: Medicaid Other

## 2016-08-23 ENCOUNTER — Ambulatory Visit
Admission: RE | Admit: 2016-08-23 | Discharge: 2016-08-23 | Disposition: A | Discharge status: Home / Self Care | Payer: Medicaid Other | Source: Ambulatory Visit | Attending: Orthopedic Surgery | Admitting: Orthopedic Surgery

## 2016-08-23 DIAGNOSIS — M542 Cervicalgia: Secondary | ICD-10-CM

## 2016-08-23 DIAGNOSIS — M545 Low back pain: Secondary | ICD-10-CM
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
## 2018-03-02 DIAGNOSIS — M2550 Pain in unspecified joint: Secondary | ICD-10-CM

## 2018-03-02 DIAGNOSIS — M25552 Pain in left hip: Secondary | ICD-10-CM | POA: Diagnosis not present

## 2018-03-02 DIAGNOSIS — R972 Elevated prostate specific antigen [PSA]: Secondary | ICD-10-CM | POA: Diagnosis not present

## 2018-03-02 DIAGNOSIS — I10 Essential (primary) hypertension: Secondary | ICD-10-CM

## 2018-05-20 ENCOUNTER — Encounter: Payer: Self-pay | Admitting: Nurse Practitioner

## 2018-05-20 ENCOUNTER — Ambulatory Visit: Payer: Medicaid Other | Admitting: Nurse Practitioner

## 2018-05-20 VITALS — BP 118/80 | HR 78 | Temp 98.2°F | Ht 72.4 in | Wt 188.4 lb

## 2018-05-20 DIAGNOSIS — M25552 Pain in left hip: Secondary | ICD-10-CM

## 2018-05-20 DIAGNOSIS — M25512 Pain in left shoulder: Secondary | ICD-10-CM | POA: Diagnosis not present

## 2018-05-20 DIAGNOSIS — G8929 Other chronic pain: Secondary | ICD-10-CM

## 2018-05-20 MED ORDER — HYDROCODONE-ACETAMINOPHEN 7.5-325 MG PO TABS: 1.0000 | ORAL_TABLET | Freq: Four times a day (QID) | ORAL | 0 refills | Status: AC | PRN

## 2018-05-20 MED ORDER — KETOROLAC TROMETHAMINE 60 MG/2ML IM SOLN
60.0000 mg | Freq: Once | INTRAMUSCULAR | Status: AC
  Administered 2018-05-20: 60 mg via INTRAMUSCULAR

## 2018-05-20 MED ORDER — TRIAMCINOLONE ACETONIDE 40 MG/ML IJ SUSP
60.0000 mg | Freq: Once | INTRAMUSCULAR | Status: AC
  Administered 2018-05-20: 60 mg via INTRAMUSCULAR

## 2018-05-20 NOTE — Progress Notes (Signed)
Subjective:     Patient ID: Thomas Lambert , male    DOB: Oct 09, 1970 , 47 y.o.   MRN: [id number]   Chief Complaint  Patient presents with  . Hip Pain    Patient states he needs a pain management doctor.    HPI  Hip Pain   The incident occurred more than 1 week ago. The pain is present in the left hip. The pain is at a severity of 10/10. The pain is severe. The pain has been constant since onset. Pertinent negatives include no inability to bear weight, loss of sensation, muscle weakness or numbness. He reports no foreign bodies present. Nothing aggravates the symptoms. The treatment provided no relief.     Past Medical History:  Diagnosis Date  . Back pain   . Condyloma acuminata   . Degenerative disc disease   . Depression   . Hypercholesteremia   . Hypertension   . Knee pain, left   . Shoulder pain, bilateral   . Sleep apnea      No family history on file.   Current Outpatient Medications:  .  lisinopril (PRINIVIL,ZESTRIL) 10 MG tablet, Take 1 tablet (10 mg total) by mouth daily. For blood pressure, Disp: 30 tablet, Rfl: 0 .  gabapentin (NEURONTIN) 600 MG tablet, Take 1 tablet (600 mg total) by mouth 3 (three) times daily at 8am, 2pm and bedtime. For anxiety, Disp: 90 tablet, Rfl: 0 .  HYDROcodone-acetaminophen (NORCO) 7.5-325 MG tablet, Take 1 tablet by mouth every 6 (six) hours as needed for moderate pain., Disp: , Rfl:  .  naproxen (NAPROSYN) 500 MG tablet, Take 500 mg by mouth 2 (two) times daily as needed. For pain. , Disp: , Rfl:  .  omega-3 acid ethyl esters (LOVAZA) 1 G capsule, Take 2 capsules (2 g total) by mouth 2 (two) times daily. For heart health (Patient not taking: Reported on 05/20/2018), Disp: , Rfl:    No Known Allergies   Review of Systems  Respiratory: Negative.   Cardiovascular: Negative.   Gastrointestinal: Negative.   Musculoskeletal: Positive for arthralgias. Negative for neck pain.       Left shoulder and left hip pain  Skin: Negative.    Neurological: Negative for numbness.     Today's Vitals   05/20/18 0934  BP: 118/80  Pulse: 78  Temp: 98.2 F (36.8 C)  TempSrc: Oral  SpO2: 98%  Weight: 188 lb 6.4 oz (85.5 kg)  Height: 6' 0.4" (1.839 m)  PainSc: 10-Worst pain ever  PainLoc: Back   Body mass index is 25.27 kg/m.   Objective:  Physical Exam  Constitutional: He is oriented to person, place, and time. He appears well-developed and well-nourished.  Cardiovascular: Normal rate and regular rhythm.  Musculoskeletal: He exhibits tenderness. He exhibits no edema or deformity.  Neurological: He is alert and oriented to person, place, and time.  Skin: Skin is warm and dry.  Vitals reviewed.       Assessment And Plan:     1. Left hip pain  Chronic  Tender to palpation to inferior hip area  He would like a referral to another pain management provider, I have stressed to him he needs to adhere to their instructions and rules so he can continue to have his chronic pain managed.   Limited supply of Norco given. - triamcinolone acetonide (KENALOG-40) injection 60 mg - ketorolac (TORADOL) injection 60 mg - Ambulatory referral to Pain Clinic - HYDROcodone-acetaminophen (NORCO) 7.5-325 MG tablet; Take 1 tablet  by mouth every 6 (six) hours as needed for moderate pain.  Dispense: 20 tablet; Refill: 0  2. Chronic left shoulder pain  Pain to shoulder with flexion to the supraspinas muscle  - triamcinolone acetonide (KENALOG-40) injection 60 mg - ketorolac (TORADOL) injection 60 mg - Ambulatory referral to Pain Clinic - HYDROcodone-acetaminophen (NORCO) 7.5-325 MG tablet; Take 1 tablet by mouth every 6 (six) hours as needed for moderate pain.  Dispense: 20 tablet; Refill: 0    Arnette FeltsJanece Faraaz Wolin, FNP

## 2018-05-20 NOTE — Patient Instructions (Signed)
Shoulder Pain Many things can cause shoulder pain, including:  An injury.  Moving the arm in the same way again and again (overuse).  Joint pain (arthritis).  Follow these instructions at home: Take these actions to help with your pain:  Squeeze a soft ball or a foam pad as much as you can. This helps to prevent swelling. It also makes the arm stronger.  Take over-the-counter and prescription medicines only as told by your doctor.  If told, put ice on the area: ? Put ice in a plastic bag. ? Place a towel between your skin and the bag. ? Leave the ice on for 20 minutes, 2-3 times per day. Stop putting on ice if it does not help with the pain.  If you were given a shoulder sling or immobilizer: ? Wear it as told. ? Remove it to shower or bathe. ? Move your arm as little as possible. ? Keep your hand moving. This helps prevent swelling.  Contact a doctor if:  Your pain gets worse.  Medicine does not help your pain.  You have new pain in your arm, hand, or fingers. Get help right away if:  Your arm, hand, or fingers: ? Tingle. ? Are numb. ? Are swollen. ? Are painful. ? Turn white or blue. This information is not intended to replace advice given to you by your health care provider. Make sure you discuss any questions you have with your health care provider. Document Released: 11/13/2007 Document Revised: 01/21/2016 Document Reviewed: 09/19/2014 Elsevier Interactive Patient Education  2018 [employer]. Hip Pain The hip is the joint between the upper legs and the lower pelvis. The bones, cartilage, tendons, and muscles of your hip joint support your body and allow you to move around. Hip pain can range from a minor ache to severe pain in one or both of your hips. The pain may be felt on the inside of the hip joint near the groin, or the outside near the buttocks and upper thigh. You may also have swelling or stiffness. Follow these instructions at home: Managing pain,  stiffness, and swelling  If directed, apply ice to the injured area. ? Put ice in a plastic bag. ? Place a towel between your skin and the bag. ? Leave the ice on for 20 minutes, 2-3 times a day  Sleep with a pillow between your legs on your most comfortable side.  Avoid any activities that cause pain. General instructions  Take over-the-counter and prescription medicines only as told by your health care provider.  Do any exercises as told by your health care provider.  Record the following: ? How often you have hip pain. ? The location of your pain. ? What the pain feels like. ? What makes the pain worse.  Keep all follow-up visits as told by your health care provider. This is important. Contact a health care provider if:  You cannot put weight on your leg.  Your pain or swelling continues or gets worse after one week.  It gets harder to walk.  You have a fever. Get help right away if:  You fall.  You have a sudden increase in pain and swelling in your hip.  Your hip is red or swollen or very tender to touch. Summary  Hip pain can range from a minor ache to severe pain in one or both of your hips.  The pain may be felt on the inside of the hip joint near the groin, or the outside  near the buttocks and upper thigh.  Avoid any activities that cause pain.  Record how often you have hip pain, the location of the pain, what makes it worse and what it feels like. This information is not intended to replace advice given to you by your health care provider. Make sure you discuss any questions you have with your health care provider. Document Released: 11/14/2009 Document Revised: 04/29/2016 Document Reviewed: 04/29/2016 Elsevier Interactive Patient Education  [employer]2018 [employer]

## 2018-05-21 DIAGNOSIS — G8929 Other chronic pain: Secondary | ICD-10-CM | POA: Insufficient documentation

## 2018-05-21 DIAGNOSIS — M25552 Pain in left hip: Secondary | ICD-10-CM | POA: Insufficient documentation

## 2018-05-21 DIAGNOSIS — M25512 Pain in left shoulder: Secondary | ICD-10-CM

## 2018-07-03 ENCOUNTER — Telehealth: Payer: Self-pay

## 2018-07-03 NOTE — Telephone Encounter (Signed)
Called pt to advise him that we referred him to preferred pain clinic but they declined him I advised him that we can search around and see If he can find one and then give [place] a call with the information. YRL,RMA

## 2018-08-12 ENCOUNTER — Ambulatory Visit: Payer: Medicaid Other | Admitting: Nurse Practitioner

## 2018-08-12 ENCOUNTER — Encounter: Payer: Self-pay | Admitting: Nurse Practitioner

## 2018-08-12 VITALS — BP 126/80 | HR 64 | Ht 72.5 in | Wt 193.6 lb

## 2018-08-12 DIAGNOSIS — G8929 Other chronic pain: Secondary | ICD-10-CM

## 2018-08-12 DIAGNOSIS — M25551 Pain in right hip: Secondary | ICD-10-CM | POA: Diagnosis not present

## 2018-08-12 DIAGNOSIS — M5441 Lumbago with sciatica, right side: Secondary | ICD-10-CM | POA: Diagnosis not present

## 2018-08-12 MED ORDER — TRIAMCINOLONE ACETONIDE 40 MG/ML IJ SUSP: 60.0000 mg | Freq: Once | INTRAMUSCULAR | Status: DC

## 2018-08-12 MED ORDER — TRIAMCINOLONE ACETONIDE 40 MG/ML IJ SUSP
60.0000 mg | Freq: Once | INTRAMUSCULAR | Status: AC
  Administered 2018-08-12: 60 mg via INTRAMUSCULAR

## 2018-08-12 MED ORDER — TRIAMCINOLONE ACETONIDE 40 MG/ML IJ SUSP: 40.0000 mg | Freq: Once | INTRAMUSCULAR | Status: AC

## 2018-08-12 MED ORDER — PREGABALIN 25 MG PO CAPS: 25.0000 mg | ORAL_CAPSULE | Freq: Three times a day (TID) | ORAL | 2 refills | Status: AC

## 2018-08-12 MED ORDER — KETOROLAC TROMETHAMINE 60 MG/2ML IM SOLN
60.0000 mg | Freq: Once | INTRAMUSCULAR | Status: AC
  Administered 2018-08-12: 60 mg via INTRAMUSCULAR

## 2018-08-12 NOTE — Progress Notes (Signed)
Subjective:     Patient ID: Thomas Lambert , male    DOB: [birth year] , 48 y.o.   MRN: [id number]   Chief Complaint  Patient presents with  . Hip Pain    hip pain left, pain with walkinggoing in with walking, was in [facility] 2 years ago,pain in left knee swelling, popping cracking  ,     HPI  [name]  Pain is consistent  Hip Pain   The incident occurred more than 1 week ago. There was no injury mechanism. Pain location: hip and right low back pain. The quality of the pain is described as aching. The pain is at a severity of 10/10. The patient is experiencing no pain. The pain has been constant since onset. Pertinent negatives include no inability to bear weight. The symptoms are aggravated by movement. He has tried NSAIDs for the symptoms. The treatment provided no relief.     Past Medical History:  Diagnosis Date  . Back pain   . Condyloma acuminata   . Degenerative disc disease   . Depression   . Hypercholesteremia   . Hypertension   . Knee pain, left   . Shoulder pain, bilateral   . Sleep apnea      No family history on file.   Current Outpatient Medications:  .  HYDROcodone-acetaminophen (NORCO) 7.5-325 MG tablet, Take 1 tablet by mouth every 6 (six) hours as needed for moderate pain. (Patient not taking: Reported on 08/12/2018), Disp: 20 tablet, Rfl: 0 .  lisinopril (PRINIVIL,ZESTRIL) 10 MG tablet, Take 1 tablet (10 mg total) by mouth daily. For blood pressure (Patient not taking: Reported on 08/12/2018), Disp: 30 tablet, Rfl: 0   No Known Allergies   Review of Systems  Constitutional: Negative.   Respiratory: Negative.   Cardiovascular: Negative.  Negative for chest pain, palpitations and leg swelling.  Musculoskeletal: Positive for back pain. Negative for gait problem.  Skin: Negative.   Neurological: Negative for dizziness and headaches.     Today's Vitals   08/12/18 1015  BP: 126/80  Pulse: 64  SpO2: 96%  Weight: 193 lb 9.6 oz (87.8 kg)  Height:  6' 0.5" (1.842 m)   Body mass index is 25.9 kg/m.   Objective:  Physical Exam Vitals signs reviewed.  Constitutional:      Appearance: Normal appearance.  Cardiovascular:     Rate and Rhythm: Normal rate and regular rhythm.     Pulses: Normal pulses.     Heart sounds: Normal heart sounds. No murmur.  Musculoskeletal:        General: Tenderness present.  Skin:    General: Skin is warm and dry.     Capillary Refill: Capillary refill takes less than 2 seconds.  Neurological:     General: No focal deficit present.     Mental Status: He is alert and oriented to person, place, and time.  Psychiatric:        Mood and Affect: Mood normal.        Behavior: Behavior normal.        Thought Content: Thought content normal.        Judgment: Judgment normal.         Assessment And Plan:     1. Right hip pain  His referral to preferred pain was declined, he would like to try and go to [facility]  Pain on palpation and slightly swollen - ketorolac (TORADOL) injection 60 mg - triamcinolone acetonide (KENALOG-40) injection 40 mg  2. Chronic bilateral low back pain with right-sided sciatica  Gabapentin was ineffective will try him on Lyrica  toradol and kenalog given in office  Will send new referral to [facility] - pregabalin (LYRICA) 25 MG capsule; Take 1 capsule (25 mg total) by mouth 3 (three) times daily.  Dispense: 90 capsule; Refill: 2       Arnette Felts, FNP

## 2018-08-19 ENCOUNTER — Encounter: Payer: Self-pay | Admitting: Nurse Practitioner
